# Patient Record
Sex: Female | Born: 1970 | Race: Black or African American | Hispanic: No | Marital: Single | Smoking: Former smoker
Health system: Southern US, Community
[De-identification: ages and names within clinical notes are randomized; demographics above are authoritative.]

## PROBLEM LIST (undated history)

## (undated) DIAGNOSIS — R079 Chest pain, unspecified: Secondary | ICD-10-CM

## (undated) DIAGNOSIS — M25569 Pain in unspecified knee: Secondary | ICD-10-CM

## (undated) DIAGNOSIS — K5900 Constipation, unspecified: Secondary | ICD-10-CM

## (undated) DIAGNOSIS — M549 Dorsalgia, unspecified: Secondary | ICD-10-CM

## (undated) DIAGNOSIS — M79606 Pain in leg, unspecified: Secondary | ICD-10-CM

## (undated) HISTORY — DX: Pain in unspecified knee: M25.569

## (undated) HISTORY — DX: Dorsalgia, unspecified: M54.9

## (undated) HISTORY — DX: Pain in leg, unspecified: M79.606

## (undated) HISTORY — DX: Chest pain, unspecified: R07.9

## (undated) HISTORY — DX: Constipation, unspecified: K59.00

## (undated) HISTORY — PX: TONSILLECTOMY: SUR1361

---

## 1998-01-03 ENCOUNTER — Emergency Department (HOSPITAL_COMMUNITY): Admission: EM | Admit: 1998-01-03 | Discharge: 1998-01-03 | Payer: Self-pay | Admitting: Emergency Medicine

## 1998-01-17 ENCOUNTER — Emergency Department (HOSPITAL_COMMUNITY): Admission: EM | Admit: 1998-01-17 | Discharge: 1998-01-17 | Payer: Self-pay | Admitting: Emergency Medicine

## 1998-08-26 ENCOUNTER — Ambulatory Visit (HOSPITAL_COMMUNITY): Admission: RE | Admit: 1998-08-26 | Discharge: 1998-08-26 | Payer: Self-pay | Admitting: Family Medicine

## 1998-08-26 ENCOUNTER — Encounter: Payer: Self-pay | Admitting: Family Medicine

## 2002-09-28 ENCOUNTER — Emergency Department (HOSPITAL_COMMUNITY): Admission: EM | Admit: 2002-09-28 | Discharge: 2002-09-28 | Payer: Self-pay | Admitting: Emergency Medicine

## 2003-01-04 ENCOUNTER — Emergency Department (HOSPITAL_COMMUNITY): Admission: EM | Admit: 2003-01-04 | Discharge: 2003-01-04 | Payer: Self-pay | Admitting: Emergency Medicine

## 2003-01-04 ENCOUNTER — Encounter: Payer: Self-pay | Admitting: Emergency Medicine

## 2003-07-19 ENCOUNTER — Inpatient Hospital Stay (HOSPITAL_COMMUNITY): Admission: AD | Admit: 2003-07-19 | Discharge: 2003-07-19 | Payer: Self-pay | Admitting: Obstetrics & Gynecology

## 2004-11-13 ENCOUNTER — Emergency Department (HOSPITAL_COMMUNITY): Admission: EM | Admit: 2004-11-13 | Discharge: 2004-11-13 | Payer: Self-pay | Admitting: Emergency Medicine

## 2006-01-25 ENCOUNTER — Emergency Department (HOSPITAL_COMMUNITY): Admission: EM | Admit: 2006-01-25 | Discharge: 2006-01-25 | Payer: Self-pay | Admitting: Emergency Medicine

## 2006-05-14 ENCOUNTER — Emergency Department (HOSPITAL_COMMUNITY): Admission: EM | Admit: 2006-05-14 | Discharge: 2006-05-14 | Payer: Self-pay | Admitting: Emergency Medicine

## 2006-10-15 ENCOUNTER — Emergency Department (HOSPITAL_COMMUNITY): Admission: EM | Admit: 2006-10-15 | Discharge: 2006-10-15 | Payer: Self-pay | Admitting: *Deleted

## 2009-11-19 ENCOUNTER — Emergency Department (HOSPITAL_COMMUNITY): Admission: EM | Admit: 2009-11-19 | Discharge: 2009-11-19 | Payer: Self-pay | Admitting: Family Medicine

## 2013-03-21 ENCOUNTER — Ambulatory Visit: Payer: Self-pay

## 2014-04-06 ENCOUNTER — Inpatient Hospital Stay (HOSPITAL_COMMUNITY): Payer: Medicaid Other

## 2014-04-06 ENCOUNTER — Encounter (HOSPITAL_COMMUNITY): Payer: Self-pay | Admitting: Emergency Medicine

## 2014-04-06 ENCOUNTER — Inpatient Hospital Stay (HOSPITAL_COMMUNITY)
Admission: AD | Admit: 2014-04-06 | Discharge: 2014-04-06 | Disposition: A | Discharge status: Home / Self Care | Payer: Self-pay | Source: Ambulatory Visit | Attending: Obstetrics and Gynecology | Admitting: Obstetrics and Gynecology

## 2014-04-06 ENCOUNTER — Encounter (HOSPITAL_COMMUNITY): Payer: Self-pay | Admitting: *Deleted

## 2014-04-06 ENCOUNTER — Emergency Department (INDEPENDENT_AMBULATORY_CARE_PROVIDER_SITE_OTHER)
Admission: EM | Admit: 2014-04-06 | Discharge: 2014-04-06 | Disposition: A | Discharge status: Home / Self Care | Payer: Self-pay | Source: Home / Self Care | Attending: Family Medicine | Admitting: Family Medicine

## 2014-04-06 DIAGNOSIS — N949 Unspecified condition associated with female genital organs and menstrual cycle: Secondary | ICD-10-CM

## 2014-04-06 DIAGNOSIS — N938 Other specified abnormal uterine and vaginal bleeding: Secondary | ICD-10-CM | POA: Insufficient documentation

## 2014-04-06 DIAGNOSIS — D259 Leiomyoma of uterus, unspecified: Secondary | ICD-10-CM | POA: Insufficient documentation

## 2014-04-06 DIAGNOSIS — N939 Abnormal uterine and vaginal bleeding, unspecified: Secondary | ICD-10-CM

## 2014-04-06 DIAGNOSIS — N83209 Unspecified ovarian cyst, unspecified side: Secondary | ICD-10-CM | POA: Insufficient documentation

## 2014-04-06 LAB — POCT URINALYSIS DIP (DEVICE)
Bilirubin Urine: NEGATIVE
GLUCOSE, UA: NEGATIVE mg/dL
KETONES UR: NEGATIVE mg/dL
NITRITE: NEGATIVE
PH: 5.5 (ref 5.0–8.0)
Protein, ur: NEGATIVE mg/dL
UROBILINOGEN UA: 0.2 mg/dL (ref 0.0–1.0)

## 2014-04-06 LAB — WET PREP, GENITAL
TRICH WET PREP: NONE SEEN
WBC, Wet Prep HPF POC: NONE SEEN
Yeast Wet Prep HPF POC: NONE SEEN

## 2014-04-06 LAB — POCT PREGNANCY, URINE: Preg Test, Ur: NEGATIVE

## 2014-04-06 NOTE — ED Provider Notes (Signed)
CSN: [id number]     Arrival date & time 04/06/14  0906 History   First MD Initiated Contact with Patient 04/06/14 0912     Chief Complaint  Patient presents with  . Vaginal Bleeding   (Consider location/radiation/quality/duration/timing/severity/associated sxs/prior Treatment) Patient is a 43 y.o. female presenting with vaginal bleeding. The history is provided by the patient.  Vaginal Bleeding Quality:  Dark red Severity:  Mild Onset quality:  Gradual Duration:  10 days Progression:  Unchanged Chronicity:  New Menstrual history:  Regular Number of tampons used:  2 Possible pregnancy: no   Context: spontaneously   Associated symptoms: abdominal pain   Associated symptoms: no fever and no vaginal discharge     History reviewed. No pertinent past medical history. History reviewed. No pertinent past surgical history. History reviewed. No pertinent family history. History  Substance Use Topics  . Smoking status: Current Some Day Smoker  . Smokeless tobacco: Not on file  . Alcohol Use: No   OB History   Grav Para Term Preterm Abortions TAB SAB Ect Mult Living                 Review of Systems  Constitutional: Negative.  Negative for fever.  Gastrointestinal: Positive for abdominal pain.  Genitourinary: Positive for vaginal bleeding, menstrual problem and pelvic pain. Negative for flank pain and vaginal discharge.    Allergies  Review of patient's allergies indicates no known allergies.  Home Medications   Prior to Admission medications   Not on File   BP 109/73  Pulse 60  Temp(Src) 98.5 F (36.9 C) (Oral)  Wt 151 lb (68.493 kg)  SpO2 100%  LMP 03/23/2014 Physical Exam  Nursing note and vitals reviewed. Constitutional: She is oriented to person, place, and time. She appears well-developed and well-nourished.  Abdominal: Soft. Bowel sounds are normal. She exhibits no distension and no mass. There is no tenderness. There is no rebound and no guarding.    Genitourinary: Uterus normal.    Uterus is not enlarged and not tender. Cervix exhibits no motion tenderness and no discharge. Right adnexum displays no mass, no tenderness and no fullness. Left adnexum displays no mass, no tenderness and no fullness. There is bleeding around the vagina. No tenderness around the vagina. No foreign body around the vagina. No vaginal discharge found.  Neurological: She is alert and oriented to person, place, and time.  Skin: Skin is warm and dry.    ED Course  Procedures (including critical care time) Labs Review Labs Reviewed  POCT URINALYSIS DIP (DEVICE) - Abnormal; Notable for the following:    Hgb urine dipstick TRACE (*)    Leukocytes, UA TRACE (*)    All other components within normal limits  POCT PREGNANCY, URINE    Imaging Review No results found.   MDM   1. DUB (dysfunctional uterine bleeding)    D[facility] with [facility], will see for further eval.    Billy Fischer, MD 04/06/14 [id number]

## 2014-04-06 NOTE — Discharge Instructions (Signed)
[facility].

## 2014-04-06 NOTE — [facility]U Provider Note (Signed)
Vanessa Carrillo is a 43 y.o. [phone] who presents to the [facility]U with irregular vaginal bleeding. She was evaluated in the Urgent Care earlier today and is her for ultrasound and further evaluation.  BP 117/68  Pulse 68  Temp(Src) 98.5 F (36.9 C)  Resp 18  Ht 5\' 2"  (1.575 m)  LMP 03/23/2014  Patient reports that she had a normal period about 2 weeks ago and then a week ago started back spotting. She has continued to have dark brown discharge. She has had a BTL and uses condoms for birth control. Current sex partner x 2 months. Hx of trichomonas and Chlamydia years ago. Last pap smear over 2 years ago in [facility] and was normal. She denies fever or chills. She had n/v x 2 yesterday but thought that was due to GERD because she had a lot of burping prior to the vomiting. She denies any other problems today.  Exam: abdomen soft, mildly tender LLQ, no guarding or rebound.  Pelvic: external genitalia without lesions, scant dark blood vaginal vault. No CMT, no adnexal tenderness, fullness right, uterus upper limits normal. Swabs obtained for GC, Chlamydia cultures and wet prep.    US Transvaginal Non-ob  04/06/2014   CLINICAL DATA:  Dysfunctional uterine bleeding and pelvic pain.  EXAM: TRANSABDOMINAL AND TRANSVAGINAL ULTRASOUND OF PELVIS  TECHNIQUE: Both transabdominal and transvaginal ultrasound examinations of the pelvis were performed. Transabdominal technique was performed for global imaging of the pelvis including uterus, ovaries, adnexal regions, and pelvic cul-de-sac. It was necessary to proceed with endovaginal exam following the transabdominal exam to visualize the ovaries and endometrium.  COMPARISON:  05/14/2006.  FINDINGS: Uterus  Measurements: 8.1 x 4.4 x 5.3 cm. Small fibroids are noted.  Endometrium  Thickness: 6.0 mm.  No focal abnormality visualized.  Right ovary  Measurements: 5.8 x 4.4 x 5.2 cm. Simple appearing 4.3 x 4.3 x 4.9 cm cyst. No worrisome sonographic features.  Left ovary   Measurements: 2.5 x 1.0 x 1.4 cm. Normal appearance/no adnexal mass.  Other findings  No free fluid.  IMPRESSION: 1. Two small uterine fibroids. 2. Normal endometrium. 3. Simple appearing 5 cm cyst associated with the right ovary. A followup pelvic ultrasound in 4-6 months is suggested. 4. Normal left ovary.   Electronically Signed   By: Kalman Jewels M.D.   On: 04/06/2014 11:29   US Pelvis Complete  04/06/2014   CLINICAL DATA:  Dysfunctional uterine bleeding and pelvic pain.  EXAM: TRANSABDOMINAL AND TRANSVAGINAL ULTRASOUND OF PELVIS  TECHNIQUE: Both transabdominal and transvaginal ultrasound examinations of the pelvis were performed. Transabdominal technique was performed for global imaging of the pelvis including uterus, ovaries, adnexal regions, and pelvic cul-de-sac. It was necessary to proceed with endovaginal exam following the transabdominal exam to visualize the ovaries and endometrium.  COMPARISON:  05/14/2006.  FINDINGS: Uterus  Measurements: 8.1 x 4.4 x 5.3 cm. Small fibroids are noted.  Endometrium  Thickness: 6.0 mm.  No focal abnormality visualized.  Right ovary  Measurements: 5.8 x 4.4 x 5.2 cm. Simple appearing 4.3 x 4.3 x 4.9 cm cyst. No worrisome sonographic features.  Left ovary  Measurements: 2.5 x 1.0 x 1.4 cm. Normal appearance/no adnexal mass.  Other findings  No free fluid.  IMPRESSION: 1. Two small uterine fibroids. 2. Normal endometrium. 3. Simple appearing 5 cm cyst associated with the right ovary. A followup pelvic ultrasound in 4-6 months is suggested. 4. Normal left ovary.   Electronically Signed   By: Veneta Penton.D.  On: 04/06/2014 11:29    Results for orders placed during the hospital encounter of 04/06/14 (from the past 24 hour(s))  WET PREP, GENITAL     Status: Abnormal   Collection Time    04/06/14 11:40 AM      Result Value Ref Range   Yeast Wet Prep HPF POC NONE SEEN  NONE SEEN   Trich, Wet Prep NONE SEEN  NONE SEEN   Clue Cells Wet Prep HPF POC FEW (*) NONE  SEEN   WBC, Wet Prep HPF POC NONE SEEN  NONE SEEN    A/P: 43 y.o. female with abnormal vaginal bleeding. Discussed with the patient clinical and lab findings and plan of care which includes follow up in the [facility] for Pap smear, mammogram and continued monitoring of right ovarian cyst and uterine fibroids. Patient agrees with plan and voices understanding. Will not start OC's or other medication at this time.

## 2014-04-06 NOTE — MAU Provider Note (Signed)

## 2014-04-06 NOTE — MAU Note (Signed)
PT presents from urgent care for follow up for vaginal bleeding.

## 2014-04-06 NOTE — ED Notes (Signed)
Pt  Reports    Vaginal  Bleeding    And      Low  abd  Pain  With        Symptoms  For  About  1  Week           Pt  Reports   Bleeding  Is  Only  Slight      Pt  Ambulated  With a  Slow  Steady  Gait

## 2014-04-06 NOTE — Discharge Instructions (Signed)
Call the [facility] to schedule a follow up for your Pap Smear, mammogram and further evaluation of your ovarian cyst and fibroids. Return here as needed for problems.

## 2014-04-08 LAB — GC/CHLAMYDIA PROBE AMP
CT PROBE, AMP APTIMA: NEGATIVE
GC Probe RNA: NEGATIVE

## 2014-04-10 ENCOUNTER — Telehealth: Payer: Self-pay | Admitting: *Deleted

## 2014-04-10 DIAGNOSIS — Z1231 Encounter for screening mammogram for malignant neoplasm of breast: Secondary | ICD-10-CM

## 2014-04-10 NOTE — Telephone Encounter (Addendum)
Vanessa Carrillo left a message she wants to get an appointment for a mammogram.  Per chart review has been seen by our doctors in [facility] and has an upcoming appt in September.  Scheduled mammogram for 04/18/14 at 10:45.

## 2014-04-10 NOTE — Telephone Encounter (Signed)
Vanessa Carrillo and gave her the appointment for her mammogram and reviewed her gyn appointment. She voiced understanding.

## 2014-04-18 ENCOUNTER — Ambulatory Visit (HOSPITAL_COMMUNITY): Payer: Medicaid Other

## 2014-06-05 ENCOUNTER — Encounter: Payer: Medicaid Other | Admitting: Obstetrics & Gynecology

## 2014-06-05 ENCOUNTER — Telehealth: Payer: Self-pay | Admitting: *Deleted

## 2014-06-05 NOTE — Telephone Encounter (Signed)
Vanessa Carrillo missed an appointment that was scheduled for  a pap smear as referred from [facility]. She states she thought it was tomorrow, she would like to reschedule it. I informed her I would have the registrars to reschedule and call her with a new appointment.

## 2014-06-17 ENCOUNTER — Encounter: Payer: Self-pay | Admitting: Obstetrics & Gynecology

## 2014-07-15 ENCOUNTER — Encounter (HOSPITAL_COMMUNITY): Payer: Self-pay | Admitting: *Deleted

## 2014-08-02 ENCOUNTER — Encounter: Payer: Medicaid Other | Admitting: Obstetrics & Gynecology

## 2014-08-02 ENCOUNTER — Telehealth: Payer: Self-pay | Admitting: *Deleted

## 2014-08-02 NOTE — Telephone Encounter (Signed)
Vanessa Carrillo missed an appointment today for pap smear.  Per chart she has missed previously as well. Called [name] and informed her she missed her scheduled appointment- she states she was not aware of appointment. Transferred her to registar to reschedule.

## 2014-10-18 ENCOUNTER — Encounter: Payer: Self-pay | Admitting: General Practice

## 2018-01-31 ENCOUNTER — Other Ambulatory Visit: Payer: Self-pay

## 2018-01-31 ENCOUNTER — Inpatient Hospital Stay (HOSPITAL_COMMUNITY)
Admission: AD | Admit: 2018-01-31 | Discharge: 2018-01-31 | Disposition: A | Discharge status: Home / Self Care | Payer: Self-pay | Source: Ambulatory Visit | Attending: Obstetrics and Gynecology | Admitting: Obstetrics and Gynecology

## 2018-01-31 ENCOUNTER — Encounter (HOSPITAL_COMMUNITY): Payer: Self-pay

## 2018-01-31 DIAGNOSIS — R1031 Right lower quadrant pain: Secondary | ICD-10-CM | POA: Insufficient documentation

## 2018-01-31 LAB — URINALYSIS, ROUTINE W REFLEX MICROSCOPIC
Bilirubin Urine: NEGATIVE
Glucose, UA: NEGATIVE mg/dL
Ketones, ur: 5 mg/dL — AB
NITRITE: NEGATIVE
Protein, ur: NEGATIVE mg/dL
Specific Gravity, Urine: 1.024 (ref 1.005–1.030)
pH: 5 (ref 5.0–8.0)

## 2018-01-31 LAB — CBC
HCT: 42.3 % (ref 36.0–46.0)
HEMOGLOBIN: 14.1 g/dL (ref 12.0–15.0)
MCH: 31.4 pg (ref 26.0–34.0)
MCHC: 33.3 g/dL (ref 30.0–36.0)
MCV: 94.2 fL (ref 78.0–100.0)
Platelets: 176 10*3/uL (ref 150–400)
RBC: 4.49 MIL/uL (ref 3.87–5.11)
RDW: 13.3 % (ref 11.5–15.5)
WBC: 9.1 10*3/uL (ref 4.0–10.5)

## 2018-01-31 LAB — POCT PREGNANCY, URINE: PREG TEST UR: NEGATIVE

## 2018-01-31 MED ORDER — KETOROLAC TROMETHAMINE 30 MG/ML IJ SOLN
30.0000 mg | Freq: Once | INTRAMUSCULAR | Status: AC
  Administered 2018-01-31: 30 mg via INTRAMUSCULAR
  Filled 2018-01-31: qty 1

## 2018-01-31 NOTE — MAU Provider Note (Signed)
Patient Vanessa Carrillo is a 47 y.o. [phone] Non-pregnant female here with complaints of side pain. Today she had nausea, vomiting, felt tingly and felt like she was having hot flashes.    History    CSN: [id number]  Arrival date and time: 01/31/18 1621   First Provider Initiated Contact with Patient 01/31/18 1755      Chief Complaint  Patient presents with  . Abdominal Pain   Abdominal Pain  This is a new problem. The current episode started today. The onset quality is sudden. The problem occurs constantly. The problem has been gradually improving. The pain is at a severity of 6/10. Associated symptoms include nausea and vomiting. Pertinent negatives include no diarrhea.    She also felt lightheaded when she tried to have a BM. She last had a bowel movement last night. She denies constipation; states that she has regular BM.    Patient went to sleep after work and woke up at 4:30. She ate a sandwich and chips, and then she started gagging. She states that she had finished eating about 10-15 minutes prior to the gagging episode. She threw up at 5 pm. She still feels a little itchy in her throat right now. She does not have to throw up right now.   OB History    Gravida  4   Para  2   Term  2   Preterm      AB  2   Living  2     SAB      TAB  2   Ectopic      Multiple      Live Births              History reviewed. No pertinent past medical history.  Past Surgical History:  Procedure Laterality Date  . CESAREAN SECTION    . TONSILLECTOMY      Family History  Problem Relation Age of Onset  . Diabetes Mother   . Diabetes Father   . Diabetes Maternal Grandmother     Social History   Tobacco Use  . Smoking status: Never Smoker  . Smokeless tobacco: Never Used  Substance Use Topics  . Alcohol use: No  . Drug use: Yes    Frequency: 2.0 times per week    Types: Marijuana    Allergies: No Known Allergies  No medications prior to admission.     Review of Systems  HENT: Negative.   Respiratory: Negative.   Gastrointestinal: Positive for abdominal pain, nausea and vomiting. Negative for diarrhea.  Genitourinary: Negative.  Negative for vaginal bleeding and vaginal discharge.  Neurological: Negative.   Psychiatric/Behavioral: Negative.    Physical Exam   Blood pressure 97/64, pulse 75, temperature 98.8 F (37.1 C), temperature source Oral, resp. rate 18, weight 177 lb 4 oz (80.4 kg), last menstrual period 01/04/2018, SpO2 100 %.  Physical Exam  Constitutional: She is oriented to person, place, and time. She appears well-developed.  HENT:  Head: Normocephalic.  Neck: Normal range of motion.  Respiratory: Effort normal.  Genitourinary:  Genitourinary Comments: Normal external female genitalia; suprapubic and adnexa are soft with no masses or tenderness.   Musculoskeletal: Normal range of motion.  Neurological: She is alert and oriented to person, place, and time.  Skin: Skin is warm and dry.  Psychiatric: She has a normal mood and affect.    MAU Course  Procedures  MDM -CBC normal -UA normal -No fever, VSS -30 mg of toradol IM and  pain now a 3/10; patient desirous of discharge.   Given patient's lack of rebound tenderness, guarding, elevated WBC or fever, CT scan or pelvic US not indicated today as low index of suspicion for appendicitis or torsion.   Assessment and Plan   1. Right lower quadrant abdominal pain    2. Discussed with patient that, given her vital signs, physical exam and lab results, my suspicion that her condition is urgent is low.   3. Reviewed with patient that lower abdominal pain can be very non-specific, it may be related to her menstrual cycle, especially since she is due to have her period in another 10 days or so.   4. Instructed patient to keep a diary of this pain and see if it returns at other points in her cycle.   5. Reviewed with patient that she should visit [facility] or [facility]  if she feels like her condition is worsening or changing. Patient verbalized understanding.  [name] 01/31/2018, 5:55 PM

## 2018-01-31 NOTE — MAU Note (Signed)
Was asleep, next thing she knew she woke up and was gagging, then she got hot,and sweaty, sudden on set of pain in her RLQ.  Feels like something is caught in her throat, tingly feeling.

## 2018-01-31 NOTE — Discharge Instructions (Signed)
-  keep track of whether or not this pain goes away or comes on at different points during your cycle -take ibuprofen for pain (up to 800 mg every 8 hours) -Return to [facility] if pain persists or you have increased nausea/vomiting or fever.

## 2019-01-04 ENCOUNTER — Other Ambulatory Visit: Payer: Self-pay

## 2019-01-04 ENCOUNTER — Encounter (HOSPITAL_COMMUNITY): Payer: Self-pay | Admitting: *Deleted

## 2019-01-04 ENCOUNTER — Emergency Department (HOSPITAL_COMMUNITY)
Admission: EM | Admit: 2019-01-04 | Discharge: 2019-01-04 | Disposition: A | Discharge status: Home / Self Care | Payer: Self-pay | Attending: Emergency Medicine | Admitting: Emergency Medicine

## 2019-01-04 DIAGNOSIS — M546 Pain in thoracic spine: Secondary | ICD-10-CM | POA: Insufficient documentation

## 2019-01-04 DIAGNOSIS — M545 Low back pain: Secondary | ICD-10-CM | POA: Insufficient documentation

## 2019-01-04 DIAGNOSIS — M7918 Myalgia, other site: Secondary | ICD-10-CM

## 2019-01-04 DIAGNOSIS — F1290 Cannabis use, unspecified, uncomplicated: Secondary | ICD-10-CM | POA: Insufficient documentation

## 2019-01-04 NOTE — Discharge Instructions (Addendum)
Please read attached information. If you experience any new or worsening signs or symptoms please return to the emergency room for evaluation. Please follow-up with your primary care provider or specialist as discussed.  °
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
## 2019-06-09 ENCOUNTER — Emergency Department (HOSPITAL_COMMUNITY)
Admission: EM | Admit: 2019-06-09 | Discharge: 2019-06-09 | Disposition: A | Discharge status: Home / Self Care | Payer: No Typology Code available for payment source | Attending: Emergency Medicine | Admitting: Emergency Medicine

## 2019-06-09 ENCOUNTER — Emergency Department (HOSPITAL_COMMUNITY): Payer: No Typology Code available for payment source

## 2019-06-09 ENCOUNTER — Encounter (HOSPITAL_COMMUNITY): Payer: Self-pay

## 2019-06-09 ENCOUNTER — Other Ambulatory Visit: Payer: Self-pay

## 2019-06-09 DIAGNOSIS — Y999 Unspecified external cause status: Secondary | ICD-10-CM | POA: Diagnosis not present

## 2019-06-09 DIAGNOSIS — R0789 Other chest pain: Secondary | ICD-10-CM | POA: Diagnosis not present

## 2019-06-09 DIAGNOSIS — S161XXA Strain of muscle, fascia and tendon at neck level, initial encounter: Secondary | ICD-10-CM | POA: Insufficient documentation

## 2019-06-09 DIAGNOSIS — S63502A Unspecified sprain of left wrist, initial encounter: Secondary | ICD-10-CM | POA: Diagnosis not present

## 2019-06-09 DIAGNOSIS — Y929 Unspecified place or not applicable: Secondary | ICD-10-CM | POA: Diagnosis not present

## 2019-06-09 DIAGNOSIS — M549 Dorsalgia, unspecified: Secondary | ICD-10-CM | POA: Insufficient documentation

## 2019-06-09 DIAGNOSIS — F1290 Cannabis use, unspecified, uncomplicated: Secondary | ICD-10-CM | POA: Diagnosis not present

## 2019-06-09 DIAGNOSIS — Y939 Activity, unspecified: Secondary | ICD-10-CM | POA: Insufficient documentation

## 2019-06-09 DIAGNOSIS — T07XXXA Unspecified multiple injuries, initial encounter: Secondary | ICD-10-CM | POA: Diagnosis present

## 2019-06-09 MED ORDER — METHOCARBAMOL 500 MG PO TABS: 500.0000 mg | ORAL_TABLET | Freq: Two times a day (BID) | ORAL | 0 refills | Status: DC

## 2019-06-09 MED ORDER — HYDROCODONE-ACETAMINOPHEN 5-325 MG PO TABS
1.0000 | ORAL_TABLET | Freq: Once | ORAL | Status: AC
  Administered 2019-06-09: 10:00:00 1 via ORAL
  Filled 2019-06-09: qty 1
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
## 2019-06-09 NOTE — ED Notes (Signed)
Patient transported to X-ray 
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
## 2019-06-09 NOTE — ED Notes (Signed)
ED Provider at bedside. 

## 2019-07-05 ENCOUNTER — Other Ambulatory Visit: Payer: Self-pay

## 2019-07-05 DIAGNOSIS — Z20822 Contact with and (suspected) exposure to covid-19: Secondary | ICD-10-CM

## 2019-07-07 LAB — NOVEL CORONAVIRUS, NAA: SARS-CoV-2, NAA: NOT DETECTED

## 2021-01-19 ENCOUNTER — Encounter (HOSPITAL_BASED_OUTPATIENT_CLINIC_OR_DEPARTMENT_OTHER): Payer: Self-pay | Admitting: Emergency Medicine

## 2021-01-19 ENCOUNTER — Other Ambulatory Visit: Payer: Self-pay

## 2021-01-19 ENCOUNTER — Emergency Department (HOSPITAL_BASED_OUTPATIENT_CLINIC_OR_DEPARTMENT_OTHER)
Admission: EM | Admit: 2021-01-19 | Discharge: 2021-01-19 | Disposition: A | Discharge status: Home / Self Care | Payer: Medicaid Other | Attending: Emergency Medicine | Admitting: Emergency Medicine

## 2021-01-19 ENCOUNTER — Emergency Department (HOSPITAL_BASED_OUTPATIENT_CLINIC_OR_DEPARTMENT_OTHER): Payer: Medicaid Other

## 2021-01-19 ENCOUNTER — Emergency Department (HOSPITAL_BASED_OUTPATIENT_CLINIC_OR_DEPARTMENT_OTHER): Payer: Medicaid Other | Admitting: Radiology

## 2021-01-19 DIAGNOSIS — M79604 Pain in right leg: Secondary | ICD-10-CM

## 2021-01-19 DIAGNOSIS — M7121 Synovial cyst of popliteal space [Baker], right knee: Secondary | ICD-10-CM | POA: Insufficient documentation

## 2021-01-19 DIAGNOSIS — M25561 Pain in right knee: Secondary | ICD-10-CM

## 2021-01-19 MED ORDER — OXYCODONE-ACETAMINOPHEN 5-325 MG PO TABS: 1.0000 | ORAL_TABLET | ORAL | 0 refills | Status: DC | PRN

## 2021-01-19 MED ORDER — OXYCODONE-ACETAMINOPHEN 5-325 MG PO TABS
1.0000 | ORAL_TABLET | Freq: Once | ORAL | Status: AC
  Administered 2021-01-19: 1 via ORAL
  Filled 2021-01-19: qty 1

## 2021-01-19 NOTE — ED Notes (Signed)
Patient verbalizes understanding of discharge instructions. Opportunity for questioning and answers were provided. Armband removed by staff, pt discharged from ED.  

## 2021-01-19 NOTE — ED Provider Notes (Signed)
[facility] Provider Note   CSN: [id number] Arrival date & time: 01/19/21  [id number]     History Chief Complaint  Patient presents with  . Leg Pain    Vanessa Carrillo is a 50 y.o. female.  The history is provided by the patient and medical records. No language interpreter was used.  Leg Pain Location:  Knee Time since incident:  5 days Injury: no   Knee location:  R knee Pain details:    Quality:  Aching   Radiates to:  Does not radiate   Severity:  Severe   Onset quality:  Gradual   Timing:  Constant   Progression:  Unchanged Chronicity:  New Dislocation: no   Tetanus status:  Unknown Prior injury to area:  No Relieved by:  Nothing Worsened by:  Bearing weight Ineffective treatments:  None tried Associated symptoms: no back pain, no fatigue, no fever, no muscle weakness, no neck pain, no numbness and no tingling        History reviewed. No pertinent past medical history.  There are no problems to display for this patient.   Past Surgical History:  Procedure Laterality Date  . CESAREAN SECTION    . TONSILLECTOMY       OB History    Gravida  4   Para  2   Term  2   Preterm      AB  2   Living  2     SAB      IAB  2   Ectopic      Multiple      Live Births              Family History  Problem Relation Age of Onset  . Diabetes Mother   . Diabetes Father   . Diabetes Maternal Grandmother     Social History   Tobacco Use  . Smoking status: Never Smoker  . Smokeless tobacco: Never Used  Substance Use Topics  . Alcohol use: No  . Drug use: Yes    Frequency: 2.0 times per week    Types: Marijuana    Home Medications Prior to Admission medications   Medication Sig Start Date End Date Taking? Authorizing Provider  methocarbamol (ROBAXIN) 500 MG tablet Take 1 tablet (500 mg total) by mouth 2 (two) times daily. 06/09/19   Volanda Napoleon, PA-C    Allergies    Patient has no known  allergies.  Review of Systems   Review of Systems  Constitutional: Negative for chills, fatigue and fever.  HENT: Negative for congestion.   Respiratory: Negative for cough, chest tightness, shortness of breath and wheezing.   Cardiovascular: Negative for chest pain.  Gastrointestinal: Negative for abdominal pain, constipation, diarrhea and nausea.  Genitourinary: Negative for flank pain.  Musculoskeletal: Negative for back pain, neck pain and neck stiffness.  Skin: Negative for rash and wound.  Neurological: Negative for headaches.  Psychiatric/Behavioral: Negative for agitation and confusion.  All other systems reviewed and are negative.   Physical Exam Updated Vital Signs BP 135/87 (BP Location: Right Arm)   Pulse 67   Temp 98.5 F (36.9 C) (Oral)   Resp 17   Ht 5\' 2"  (1.575 m)   Wt 74.8 kg   LMP 01/04/2021   SpO2 99%   BMI 30.18 kg/m   Physical Exam Vitals and nursing note reviewed.  Constitutional:      General: She is not in acute distress.    Appearance: She  is well-developed. She is not ill-appearing, toxic-appearing or diaphoretic.  HENT:     Head: Normocephalic and atraumatic.     Mouth/Throat:     Mouth: Mucous membranes are moist.  Eyes:     Conjunctiva/sclera: Conjunctivae normal.     Pupils: Pupils are equal, round, and reactive to light.  Cardiovascular:     Rate and Rhythm: Normal rate and regular rhythm.     Heart sounds: No murmur heard.   Pulmonary:     Effort: Pulmonary effort is normal. No respiratory distress.     Breath sounds: Normal breath sounds.  Abdominal:     Palpations: Abdomen is soft.     Tenderness: There is no abdominal tenderness. There is no right CVA tenderness, left CVA tenderness, guarding or rebound.  Musculoskeletal:        General: Tenderness present.     Cervical back: Neck supple.     Right knee: No swelling, deformity, erythema or crepitus. Normal range of motion. Tenderness present.     Right lower leg: No edema.      Left lower leg: No edema.       Legs:     Comments: Normal, sensation, strength, pulses distally.  Tenderness in the knee and behind the knee just going up the leg.  No redness or warmth.   Skin:    General: Skin is warm and dry.     Capillary Refill: Capillary refill takes less than 2 seconds.     Findings: No erythema.  Neurological:     General: No focal deficit present.     Mental Status: She is alert.  Psychiatric:        Mood and Affect: Mood normal.     ED Results / Procedures / Treatments   Labs (all labs ordered are listed, but only abnormal results are displayed) Labs Reviewed - No data to display  EKG None  Radiology US Venous Img Lower Unilateral Right  Result Date: 01/19/2021 CLINICAL DATA:  Right lower extremity pain after moving furniture 1 week ago. Evaluate for DVT. EXAM: RIGHT LOWER EXTREMITY VENOUS DOPPLER ULTRASOUND TECHNIQUE: Gray-scale sonography with graded compression, as well as color Doppler and duplex ultrasound were performed to evaluate the lower extremity deep venous systems from the level of the common femoral vein and including the common femoral, femoral, profunda femoral, popliteal and calf veins including the posterior tibial, peroneal and gastrocnemius veins when visible. The superficial great saphenous vein was also interrogated. Spectral Doppler was utilized to evaluate flow at rest and with distal augmentation maneuvers in the common femoral, femoral and popliteal veins. COMPARISON:  None. FINDINGS: Contralateral Common Femoral Vein: Respiratory phasicity is normal and symmetric with the symptomatic side. No evidence of thrombus. Normal compressibility. Common Femoral Vein: No evidence of thrombus. Normal compressibility, respiratory phasicity and response to augmentation. Saphenofemoral Junction: No evidence of thrombus. Normal compressibility and flow on color Doppler imaging. Profunda Femoral Vein: No evidence of thrombus. Normal  compressibility and flow on color Doppler imaging. Femoral Vein: No evidence of thrombus. Normal compressibility, respiratory phasicity and response to augmentation. Popliteal Vein: No evidence of thrombus. Normal compressibility, respiratory phasicity and response to augmentation. Calf Veins: No evidence of thrombus. Normal compressibility and flow on color Doppler imaging. Superficial Great Saphenous Vein: No evidence of thrombus. Normal compressibility. Venous Reflux:  None. Other Findings: Note is made of an approximately 5.1 x 5.1 x 3.0 cm fluid collection within the right popliteal fossa compatible with a Baker's cyst. IMPRESSION: 1. No  evidence of DVT within the right lower extremity. 2. Incidental note made of an approximately 5.1 cm right-sided Baker's cyst. Electronically Signed   By: Sandi Mariscal M.D.   On: 01/19/2021 11:37   DG Knee Complete 4 Views Right  Result Date: 01/19/2021 CLINICAL DATA:  Right knee and leg pain, medial and posterior knee pain EXAM: RIGHT KNEE - COMPLETE 4+ VIEW COMPARISON:  None. FINDINGS: No evidence of fracture, dislocation, or joint effusion. Mild lateral compartment osteophytosis without other evidence of osteophytosis and preserved joint spaces throughout. Soft tissues are unremarkable. IMPRESSION: 1.  No fracture or dislocation of the right knee. 2. Mild lateral compartment osteophytosis without other evidence of osteophytosis and preserved joint spaces throughout. 3.  No joint effusion. Electronically Signed   By: Eddie Candle M.D.   On: 01/19/2021 11:51    Procedures Procedures   Medications Ordered in ED Medications  oxyCODONE-acetaminophen (PERCOCET/ROXICET) 5-325 MG per tablet 1 tablet (1 tablet Oral Given 01/19/21 1220)    ED Course  I have reviewed the triage vital signs and the nursing notes.  Pertinent labs & imaging results that were available during my care of the patient were reviewed by me and considered in my medical decision making (see chart  for details).    MDM Rules/Calculators/A&P                          DEBBIE YEARICK is a 50 y.o. female with no significant past medical history who presents with several days of right knee and leg pain.  She reports that she is very concerned about blood clot as a close member had 1 and died.  She reports that she was moving around furniture in her room over the last week and was using her to push furniture around.  She denies an acute trauma event but reports since then it has been hurting.  She describes a 7 out of 10 at baseline and up to a 10 out of 10 in severity at times.  She denies numbness or weakness.  Denies any hip pain or other symptoms.  She denies a history of DVT or PE.  Denies any history of knee surgery or injuries.  On exam, patient does have tenderness in the bones of the right knee but does have normal range of motion with passive manipulation.  She has tenderness in the posterior of the knee and the distal posterior upper leg.  Normal sensation and strength and pulses distally.  No rashes seen.  It is not warm or significantly swollen on my exam.  No hip tenderness.  Lungs clear and chest nontender.  Exam otherwise unremarkable.  Clinically I suspect soft tissue pain and injury with overuse from all of the furniture manipulation last week.  Given her family history and concern, we will get a DVT ultrasound to rule out DVT in the posterior knee and leg.  We will also get x-ray to look for bony injury.  If work-up is reassuring, anticipate knee immobilization and follow-up with orthopedics for further likely soft tissue or ligamentous injury.  1:02 PM DVT ultrasound was negative for DVT but did show a Baker's cyst.  This likely explains the knot she was feeling.  X-ray shows no fracture dislocation but shows some arthritis.  We will place the patient knee immobilizer and give crutches.  She will be given her pain medicine as I still cannot rule out a ligamentous type injury.   Patient will  follow up with orthopedics and treat the Baker's cyst symptomatically.  She had no other questions or concerns and was discharged in good condition.     Final Clinical Impression(s) / ED Diagnoses Final diagnoses:  Baker cyst, right  Right leg pain  Acute pain of right knee    Rx / DC Orders ED Discharge Orders         Ordered    oxyCODONE-acetaminophen (PERCOCET/ROXICET) 5-325 MG tablet  Every 4 hours PRN        01/19/21 1303          Clinical Impression: 1. Baker cyst, right   2. Right leg pain   3. Acute pain of right knee     Disposition: Discharge  Condition: Good  I have discussed the results, Dx and Tx plan with the pt(& family if present). He/she/they expressed understanding and agree(s) with the plan. Discharge instructions discussed at great length. Strict return precautions discussed and pt &/or family have verbalized understanding of the instructions. No further questions at time of discharge.    New Prescriptions   OXYCODONE-ACETAMINOPHEN (PERCOCET/ROXICET) 5-325 MG TABLET    Take 1 tablet by mouth every 4 (four) hours as needed for severe pain.    Follow Up: [facility], [address] [place] [address] [address] [phone]   with ortho if symptoms persist     Raheim Beutler, Gwenyth Allegra, MD 01/19/21 1306

## 2021-01-19 NOTE — Discharge Instructions (Signed)
Your ultrasound today showed no evidence of blood clot but did show the Baker's cyst we discussed.  As I cannot rule out a ligamentous or soft tissue injury, we will give you the knee immobilizer and crutches and pain medicine.  Please follow-up with orthopedics if your symptoms persist.  Please use rest, elevation, ice, and compression help with your symptoms as well.  If any symptoms change or worsen, please return to the nearest emergency department.

## 2021-01-19 NOTE — ED Triage Notes (Signed)
Pt states that she has a a knot behind her right knee for the past 5 days. Pt stated that she was moving furniture around about the same times symptoms first appeared.

## 2021-01-19 NOTE — ED Notes (Signed)
Patient transported to Ultrasound 

## 2021-04-08 ENCOUNTER — Ambulatory Visit: Payer: No Typology Code available for payment source | Attending: Critical Care Medicine

## 2021-04-08 DIAGNOSIS — Z20822 Contact with and (suspected) exposure to covid-19: Secondary | ICD-10-CM

## 2021-04-09 LAB — NOVEL CORONAVIRUS, NAA: SARS-CoV-2, NAA: NOT DETECTED

## 2021-04-09 LAB — SARS-COV-2, NAA 2 DAY TAT

## 2022-04-21 ENCOUNTER — Ambulatory Visit: Payer: Commercial Managed Care - HMO | Admitting: Family Medicine

## 2022-04-21 VITALS — BP 128/89 | HR 65 | Temp 97.9°F | Ht 62.0 in | Wt 192.0 lb

## 2022-04-21 DIAGNOSIS — Z129 Encounter for screening for malignant neoplasm, site unspecified: Secondary | ICD-10-CM

## 2022-04-21 DIAGNOSIS — R5383 Other fatigue: Secondary | ICD-10-CM

## 2022-04-21 DIAGNOSIS — B379 Candidiasis, unspecified: Secondary | ICD-10-CM

## 2022-04-21 DIAGNOSIS — L309 Dermatitis, unspecified: Secondary | ICD-10-CM

## 2022-04-21 LAB — POCT URINALYSIS DIPSTICK
Bilirubin, UA: NEGATIVE
Blood, UA: NEGATIVE
Glucose, UA: NEGATIVE
Ketones, UA: NEGATIVE
Leukocytes, UA: NEGATIVE
Protein, UA: NEGATIVE
Spec Grav, UA: 1.025
Urobilinogen, UA: 0.2 U/dL
pH, UA: 5

## 2022-04-21 MED ORDER — LORATADINE 10 MG PO TABS: 10.0000 mg | ORAL_TABLET | Freq: Every day | ORAL | 11 refills | Status: DC

## 2022-04-21 MED ORDER — HYDROCORTISONE 1 % EX OINT: 1.0000 | TOPICAL_OINTMENT | Freq: Two times a day (BID) | CUTANEOUS | 0 refills | Status: DC

## 2022-04-21 NOTE — Progress Notes (Unsigned)
   New Patient Office Visit  Subjective    Patient ID: Vanessa Carrillo, female    DOB: 09/13/1971  Age: 51 y.o. MRN: [id number]  CC:  Chief Complaint  Patient presents with   Establish Care    HPI [name] presents to establish care ***  Outpatient Encounter Medications as of 04/21/2022  Medication Sig   methocarbamol (ROBAXIN) 500 MG tablet Take 1 tablet (500 mg total) by mouth 2 (two) times daily. (Patient not taking: Reported on 04/21/2022)   oxyCODONE-acetaminophen (PERCOCET/ROXICET) 5-325 MG tablet Take 1 tablet by mouth every 4 (four) hours as needed for severe pain. (Patient not taking: Reported on 04/21/2022)   No facility-administered encounter medications on file as of 04/21/2022.    No past medical history on file.  Past Surgical History:  Procedure Laterality Date   CESAREAN SECTION     TONSILLECTOMY      Family History  Problem Relation Age of Onset   Diabetes Mother    Diabetes Father    Diabetes Maternal Grandmother     Social History   Socioeconomic History   Marital status: Single    Spouse name: Not on file   Number of children: Not on file   Years of education: Not on file   Highest education level: Not on file  Occupational History   Not on file  Tobacco Use   Smoking status: Never   Smokeless tobacco: Never  Substance and Sexual Activity   Alcohol use: No   Drug use: Yes    Frequency: 2.0 times per week    Types: Marijuana   Sexual activity: Yes    Birth control/protection: Surgical  Other Topics Concern   Not on file  Social History Narrative   Not on file   Social Determinants of Health   Financial Resource Strain: Not on file  Food Insecurity: Not on file  Transportation Needs: Not on file  Physical Activity: Not on file  Stress: Not on file  Social Connections: Not on file  Intimate Partner Violence: Not on file    ROS      Objective    BP 128/89   Pulse 65   Temp 97.9 F (36.6 C)   Ht '5\' 2"'$  (1.575 m)   Wt  192 lb (87.1 kg)   SpO2 96%   BMI 35.12 kg/m   Physical Exam  {Labs (Optional):23779}    EKG - nsr, nl axis,  no stemi, nl ekg, rr 986, pr 144  Assessment & Plan:   Problem List Items Addressed This Visit   None Visit Diagnoses     Candida infection    -  Primary   Relevant Orders   CBC with Differential/Platelet   Comprehensive metabolic panel   Eczema, unspecified type       Cancer screening       Relevant Orders   MM Digital Screening   Cologuard   Morbid obesity ([place])       Relevant Orders   Hgb A1c w/o eAG   Lipid Panel w/o Chol/HDL Ratio   TSH + free T4   Other fatigue       Relevant Orders   POCT urinalysis dipstick   EKG 12-Lead       No follow-ups on file.   Elwin Mocha, MD

## 2022-04-21 NOTE — Progress Notes (Unsigned)
U/A collected. TM

## 2022-04-22 LAB — CBC WITH DIFFERENTIAL/PLATELET
Basophils Absolute: 0 10*3/uL (ref 0.0–0.2)
Basos: 0 %
EOS (ABSOLUTE): 0.1 10*3/uL (ref 0.0–0.4)
Eos: 1 %
Hematocrit: 44.3 % (ref 34.0–46.6)
Hemoglobin: 15 g/dL (ref 11.1–15.9)
Immature Grans (Abs): 0 10*3/uL (ref 0.0–0.1)
Immature Granulocytes: 0 %
Lymphocytes Absolute: 2.7 10*3/uL (ref 0.7–3.1)
Lymphs: 47 %
MCH: 32 pg (ref 26.6–33.0)
MCHC: 33.9 g/dL (ref 31.5–35.7)
MCV: 95 fL (ref 79–97)
Monocytes Absolute: 0.4 10*3/uL (ref 0.1–0.9)
Monocytes: 8 %
Neutrophils Absolute: 2.5 10*3/uL (ref 1.4–7.0)
Neutrophils: 44 %
Platelets: 182 10*3/uL (ref 150–450)
RBC: 4.69 x10E6/uL (ref 3.77–5.28)
RDW: 12.9 % (ref 11.7–15.4)
WBC: 5.8 10*3/uL (ref 3.4–10.8)

## 2022-04-22 LAB — COMPREHENSIVE METABOLIC PANEL
ALT: 9 IU/L (ref 0–32)
AST: 13 IU/L (ref 0–40)
Albumin/Globulin Ratio: 1.5 (ref 1.2–2.2)
Albumin: 4.1 g/dL (ref 3.8–4.9)
Alkaline Phosphatase: 100 IU/L (ref 44–121)
BUN/Creatinine Ratio: 16 (ref 9–23)
BUN: 11 mg/dL (ref 6–24)
Bilirubin Total: <0.2 mg/dL (ref 0.0–1.2)
CO2: 22 mmol/L (ref 20–29)
Calcium: 9 mg/dL (ref 8.7–10.2)
Chloride: 106 mmol/L (ref 96–106)
Creatinine, Ser: 0.67 mg/dL (ref 0.57–1.00)
Globulin, Total: 2.8 g/dL (ref 1.5–4.5)
Glucose: 99 mg/dL (ref 70–99)
Potassium: 4.1 mmol/L (ref 3.5–5.2)
Sodium: 142 mmol/L (ref 134–144)
Total Protein: 6.9 g/dL (ref 6.0–8.5)
eGFR: 106 mL/min/{1.73_m2} (ref >59)

## 2022-04-22 LAB — LIPID PANEL W/O CHOL/HDL RATIO
Cholesterol, Total: 193 mg/dL (ref 100–199)
HDL: 68 mg/dL (ref >39)
LDL Chol Calc (NIH): 109 mg/dL — ABNORMAL HIGH (ref 0–99)
Triglycerides: 91 mg/dL (ref 0–149)
VLDL Cholesterol Cal: 16 mg/dL (ref 5–40)

## 2022-04-22 LAB — TSH+FREE T4
Free T4: 0.91 ng/dL (ref 0.82–1.77)
TSH: 1.47 u[IU]/mL (ref 0.450–4.500)

## 2022-04-22 LAB — HGB A1C W/O EAG: Hgb A1c MFr Bld: 5.6 % (ref 4.8–5.6)

## 2022-04-30 ENCOUNTER — Ambulatory Visit: Payer: Commercial Managed Care - HMO | Admitting: Nurse Practitioner

## 2022-05-15 ENCOUNTER — Other Ambulatory Visit: Payer: Self-pay

## 2022-05-15 ENCOUNTER — Encounter (HOSPITAL_BASED_OUTPATIENT_CLINIC_OR_DEPARTMENT_OTHER): Payer: Self-pay | Admitting: Emergency Medicine

## 2022-05-15 ENCOUNTER — Emergency Department (HOSPITAL_BASED_OUTPATIENT_CLINIC_OR_DEPARTMENT_OTHER)
Admission: EM | Admit: 2022-05-15 | Discharge: 2022-05-15 | Disposition: A | Discharge status: Home / Self Care | Payer: Commercial Managed Care - HMO | Attending: Emergency Medicine | Admitting: Emergency Medicine

## 2022-05-15 ENCOUNTER — Emergency Department (HOSPITAL_BASED_OUTPATIENT_CLINIC_OR_DEPARTMENT_OTHER): Payer: Commercial Managed Care - HMO

## 2022-05-15 DIAGNOSIS — I10 Essential (primary) hypertension: Secondary | ICD-10-CM | POA: Diagnosis not present

## 2022-05-15 DIAGNOSIS — R0789 Other chest pain: Secondary | ICD-10-CM | POA: Diagnosis not present

## 2022-05-15 DIAGNOSIS — R079 Chest pain, unspecified: Secondary | ICD-10-CM | POA: Diagnosis present

## 2022-05-15 DIAGNOSIS — E119 Type 2 diabetes mellitus without complications: Secondary | ICD-10-CM | POA: Insufficient documentation

## 2022-05-15 LAB — BASIC METABOLIC PANEL
Anion gap: 6 (ref 5–15)
BUN: 18 mg/dL (ref 6–20)
CO2: 23 mmol/L (ref 22–32)
Calcium: 8.6 mg/dL — ABNORMAL LOW (ref 8.9–10.3)
Chloride: 109 mmol/L (ref 98–111)
Creatinine, Ser: 0.91 mg/dL (ref 0.44–1.00)
GFR, Estimated: >60 mL/min (ref >60)
Glucose, Bld: 115 mg/dL — ABNORMAL HIGH (ref 70–99)
Potassium: 3.4 mmol/L — ABNORMAL LOW (ref 3.5–5.1)
Sodium: 138 mmol/L (ref 135–145)

## 2022-05-15 LAB — CBC
HCT: 40.9 % (ref 36.0–46.0)
Hemoglobin: 13.6 g/dL (ref 12.0–15.0)
MCH: 31.6 pg (ref 26.0–34.0)
MCHC: 33.3 g/dL (ref 30.0–36.0)
MCV: 94.9 fL (ref 80.0–100.0)
Platelets: 163 10*3/uL (ref 150–400)
RBC: 4.31 MIL/uL (ref 3.87–5.11)
RDW: 13.5 % (ref 11.5–15.5)
WBC: 6.9 10*3/uL (ref 4.0–10.5)
nRBC: 0 % (ref 0.0–0.2)

## 2022-05-15 LAB — TROPONIN I (HIGH SENSITIVITY): Troponin I (High Sensitivity): 2 ng/L (ref <18)

## 2022-05-15 MED ORDER — NAPROXEN 500 MG PO TABS
500.0000 mg | ORAL_TABLET | Freq: Two times a day (BID) | ORAL | 0 refills | Status: DC
Start: 2022-05-15 — End: 2022-11-12

## 2022-05-15 MED ORDER — KETOROLAC TROMETHAMINE 15 MG/ML IJ SOLN
15.0000 mg | Freq: Once | INTRAMUSCULAR | Status: AC
  Administered 2022-05-15: 15 mg via INTRAVENOUS
  Filled 2022-05-15: qty 1

## 2022-05-15 NOTE — ED Triage Notes (Signed)
Left sided chest pain since yesterday. Describes pain as sharp and constant. Denies radiation.

## 2022-05-15 NOTE — ED Provider Notes (Signed)
[facility] Provider Note   CSN: [id number] Arrival date & time: 05/15/22  1614     History  Chief Complaint  Patient presents with   Chest Pain    Vanessa Carrillo is a 51 y.o. female.  51 year old female with past medical history that is significant for hypertension, diabetes, hyperlipidemia presents today for evaluation of left-sided chest pain ongoing for the past 2 days.  She states up until today her chest pain was intermittent however today it has remained constant.  Chest pain is worse with deep breathing, coughing, and with positional change.  She denies any leg swelling, leg pain, history of DVT or PE, recent long travel, recent surgery.  She denies prior history of CAD, or MI.  Reports history of CAD for both of her parents.  She denies recent illness, or current URI symptoms.  The history is provided by the patient. No language interpreter was used.       Home Medications Prior to Admission medications   Medication Sig Start Date End Date Taking? Authorizing Provider  hydrocortisone 1 % ointment Apply 1 Application topically 2 (two) times daily. Use for one week and off one week 04/21/22   Elwin Mocha, MD  loratadine (CLARITIN) 10 MG tablet Take 1 tablet (10 mg total) by mouth daily. 04/21/22   Elwin Mocha, MD  methocarbamol (ROBAXIN) 500 MG tablet Take 1 tablet (500 mg total) by mouth 2 (two) times daily. Patient not taking: Reported on 04/21/2022 06/09/19   Volanda Napoleon, PA-C  oxyCODONE-acetaminophen (PERCOCET/ROXICET) 5-325 MG tablet Take 1 tablet by mouth every 4 (four) hours as needed for severe pain. Patient not taking: Reported on 04/21/2022 01/19/21   Tegeler, Gwenyth Allegra, MD      Allergies    Patient has no known allergies.    Review of Systems   Review of Systems  Constitutional:  Negative for chills and fever.  Respiratory:  Negative for cough and shortness of breath.   Cardiovascular:  Positive for chest pain.  Negative for palpitations and leg swelling.  Gastrointestinal:  Negative for abdominal pain and nausea.  All other systems reviewed and are negative.   Physical Exam Updated Vital Signs BP (!) 146/78 (BP Location: Right Arm)   Pulse 64   Temp 98 F (36.7 C) (Oral)   Resp 17   Ht '5\' 2"'$  (1.575 m)   Wt 90.3 kg   LMP 04/27/2022 (Approximate)   SpO2 98%   BMI 36.40 kg/m  Physical Exam Vitals and nursing note reviewed.  Constitutional:      General: She is not in acute distress.    Appearance: Normal appearance. She is not ill-appearing.  HENT:     Head: Normocephalic and atraumatic.     Nose: Nose normal.  Eyes:     General: No scleral icterus.    Extraocular Movements: Extraocular movements intact.     Conjunctiva/sclera: Conjunctivae normal.  Cardiovascular:     Rate and Rhythm: Normal rate and regular rhythm.     Pulses: Normal pulses.     Heart sounds:     No friction rub.  Pulmonary:     Effort: Pulmonary effort is normal. No respiratory distress.     Breath sounds: Normal breath sounds. No wheezing or rales.  Abdominal:     General: There is no distension.     Palpations: Abdomen is soft.     Tenderness: There is no abdominal tenderness.  Musculoskeletal:  General: Normal range of motion.     Cervical back: Normal range of motion.     Right lower leg: No edema.     Left lower leg: No edema.  Skin:    General: Skin is warm and dry.  Neurological:     General: No focal deficit present.     Mental Status: She is alert. Mental status is at baseline.     ED Results / Procedures / Treatments   Labs (all labs ordered are listed, but only abnormal results are displayed) Labs Reviewed  BASIC METABOLIC PANEL  CBC  PREGNANCY, URINE  TROPONIN I (HIGH SENSITIVITY)    EKG EKG Interpretation  Date/Time:  Saturday May 15 2022 16:27:39 EDT Ventricular Rate:  61 PR Interval:  155 QRS Duration: 110 QT Interval:  416 QTC Calculation: 419 R  Axis:   89 Text Interpretation: Sinus rhythm No old tracing to compare Confirmed by Aletta Edouard [phone]) on 05/15/2022 4:38:56 PM  Radiology No results found.  Procedures Procedures    Medications Ordered in ED Medications - No data to display  ED Course/ Medical Decision Making/ A&P                           Medical Decision Making Amount and/or Complexity of Data Reviewed Labs: ordered. Radiology: ordered.  Risk Prescription drug management.   Medical Decision Making / ED Course   This patient presents to the ED for concern of chest pain, this involves an extensive number of treatment options, and is a complaint that carries with it a high risk of complications and morbidity.  The differential diagnosis includes ACS, pneumonia, PE, MSK pain, pneumothorax, aortic dissection  MDM: 51 year old female presents today for evaluation of 3-day duration of left-sided chest pain.  Chest pain is positional, pleuritic, and worse with palpation.  She does have history of hypertension, diabetes, hyperlipidemia.  However she states these are all managed through lifestyle changes.  She is not on any medications for any 1 of these.  Recently had her physical and blood work checked.  States good control of her diabetes, and cholesterol.  On chart review her LDL was 109.  A1c of 5.6.  EKG without acute ischemic changes.  Chest x-ray without acute cardiopulmonary process.  CBC is unremarkable.  BMP shows potassium 3.4, glucose 115 otherwise unremarkable.  Initial troponin of 2.  Given symptoms have been ongoing now for 3 days and troponin is normal, EKG is without acute ischemic changes.  Doubt ACS.  Considered pericarditis however given no EKG changes, normal troponin, pain does not improve with sitting up, without appreciable friction rub low suspicion for pericarditis.  Patient does have cough during my exam.  She states she was not aware of cough prior to arriving into the emergency room.  Only 1  episode of cough during her emergency room stay.  Given work-up and patient's presentation most likely costochondritis.  She was given a dose of Toradol.  Did have mild improvement.  Will start patient on naproxen course.  Strict return precautions discussed with patient.  Given her risk factors, and family history for CAD we will provide her a referral to cardiology.  Discussed follow-up with PCP for reevaluation.  I did ambulate patient within the room and she was able to ambulate without shortness of breath, or desaturation.  She is low risk for PE.  Not on OCPs.  She is without tachypnea, tachycardia, or hypoxia.  Doubt PE.  Patient is appropriate for discharge.  Discharged in stable condition.  Return precautions discussed.   Lab Tests: -I ordered, reviewed, and interpreted labs.   The pertinent results include:   Labs Reviewed  BASIC METABOLIC PANEL  CBC  PREGNANCY, URINE  TROPONIN I (HIGH SENSITIVITY)      EKG  EKG Interpretation  Date/Time:  Saturday May 15 2022 16:27:39 EDT Ventricular Rate:  61 PR Interval:  155 QRS Duration: 110 QT Interval:  416 QTC Calculation: 419 R Axis:   89 Text Interpretation: Sinus rhythm No old tracing to compare Confirmed by Aletta Edouard [phone]) on 05/15/2022 4:38:56 PM         Imaging Studies ordered: I ordered imaging studies including chest x-ray I independently visualized and interpreted imaging. I agree with the radiologist interpretation   Medicines ordered and prescription drug management: No orders of the defined types were placed in this encounter.   -I have reviewed the patients home medicines and have made adjustments as needed  Reevaluation: After the interventions noted above, I reevaluated the patient and found that they have :improved  Co morbidities that complicate the patient evaluation History reviewed. No pertinent past medical history.    Dispostion: Patient is appropriate for discharge.  Discharged in  stable condition.  Return precautions discussed.   Final Clinical Impression(s) / ED Diagnoses Final diagnoses:  Atypical chest pain    Rx / DC Orders ED Discharge Orders          Ordered    Ambulatory referral to Cardiology       Comments: If you have not heard from the Cardiology office within the next 72 hours please call [phone].   05/15/22 1915    naproxen (NAPROSYN) 500 MG tablet  2 times daily        05/15/22 1916              Evlyn Courier, PA-C 05/15/22 1916    Hayden Rasmussen, MD 05/16/22 1006

## 2022-05-15 NOTE — Discharge Instructions (Addendum)
Your work-up today was overall reassuring.  Your heart enzyme, EKG did not show any concerning findings.  X-ray did not show any concerning findings.  Feel your most likely cause of chest pain is likely costochondritis which is inflammation surrounding the joint where ribs come into contact with your breastbone.  I have sent in naproxen which is an anti-inflammatory to help.  If you have any worsening symptoms, shortness of breath, chest pain or other concerning symptoms please return to the emergency room for evaluation.  Given your risk factors for heart disease, and your family history of heart disease I have given you a referral to cardiology.  They will call to schedule this appointment if you do not hear from them by Wednesday or Thursday please call to schedule an appointment with them.

## 2022-05-21 ENCOUNTER — Ambulatory Visit
Admission: RE | Admit: 2022-05-21 | Discharge: 2022-05-21 | Disposition: A | Discharge status: Home / Self Care | Payer: Commercial Managed Care - HMO | Source: Ambulatory Visit | Attending: Family Medicine | Admitting: Family Medicine

## 2022-05-21 DIAGNOSIS — Z129 Encounter for screening for malignant neoplasm, site unspecified: Secondary | ICD-10-CM

## 2022-06-24 ENCOUNTER — Ambulatory Visit: Payer: Commercial Managed Care - HMO | Admitting: Internal Medicine

## 2022-11-05 ENCOUNTER — Ambulatory Visit
Admission: RE | Admit: 2022-11-05 | Discharge: 2022-11-05 | Disposition: A | Discharge status: Home / Self Care | Payer: Commercial Managed Care - HMO | Source: Ambulatory Visit | Attending: Family Medicine | Admitting: Family Medicine

## 2022-11-05 ENCOUNTER — Ambulatory Visit: Payer: Medicaid Other | Admitting: Family Medicine

## 2022-11-05 DIAGNOSIS — R0789 Other chest pain: Secondary | ICD-10-CM

## 2022-11-05 DIAGNOSIS — K5900 Constipation, unspecified: Secondary | ICD-10-CM

## 2022-11-05 MED ORDER — POLYETHYLENE GLYCOL 3350 17 GM/SCOOP PO POWD: 17.0000 g | Freq: Three times a day (TID) | ORAL | 0 refills | Status: AC

## 2022-11-05 MED ORDER — BISACODYL 5 MG PO TBEC: 10.0000 mg | DELAYED_RELEASE_TABLET | Freq: Every day | ORAL | 0 refills | Status: DC | PRN

## 2022-11-05 NOTE — Progress Notes (Incomplete)
   Acute Office Visit  Subjective:     Patient ID: Vanessa Carrillo, female    DOB: 03-Dec-1970, 52 y.o.   MRN: [id number]  No chief complaint on file.   HPI Patient is in today for ***  ROS      Objective:    There were no vitals taken for this visit. {Vitals History (Optional):23777}  Physical Exam  No results found for any visits on 11/05/22.      Assessment & Plan:   Problem List Items Addressed This Visit   None Visit Diagnoses     Constipation, unspecified constipation type    -  Primary   Relevant Medications   polyethylene glycol powder (GLYCOLAX/MIRALAX) 17 GM/SCOOP powder   bisacodyl (DULCOLAX) 5 MG EC tablet   Other Relevant Orders   DG Abd 1 View   Atypical chest pain           Meds ordered this encounter  Medications  . polyethylene glycol powder (GLYCOLAX/MIRALAX) 17 GM/SCOOP powder    Sig: Take 17 g by mouth 3 (three) times daily for 3 days.    Dispense:  225 g    Refill:  0  . bisacodyl (DULCOLAX) 5 MG EC tablet    Sig: Take 2 tablets (10 mg total) by mouth daily as needed for moderate constipation.    Dispense:  30 tablet    Refill:  0    No follow-ups on file.  Elwin Mocha, MD

## 2022-11-11 NOTE — Progress Notes (Signed)
Chief Complaint  Patient presents with   New Patient (Initial Visit)    Chest pain   History of Present Illness: 52 yo female with history of chest pain who is here today as a new patient, referred by Dr. Aggie Moats,  for the evaluation of chest pain. She has no known cardiac issues. Her mother had an MI at age 80. Her grandmother had an MI at age 28. She tells me today that she has pain in her lower chest at rest. The pain can occur after meals or when she is sitting around. No clear exertional component. The pain can last up to an hour. No associated dyspnea, dizziness or LE edema. The pain has been occurring 2-3 times per month for the last year.  She has never smoked cigarettes.   Primary Care Physician: Elwin Mocha, MD  Past Medical History:  Diagnosis Date   Back pain    Chest pain    Constipation    Knee pain    Leg pain     Past Surgical History:  Procedure Laterality Date   CESAREAN SECTION     TONSILLECTOMY      Current Outpatient Medications  Medication Sig Dispense Refill   bisacodyl (DULCOLAX) 5 MG EC tablet Take 2 tablets (10 mg total) by mouth daily as needed for moderate constipation. 30 tablet 0   Multiple Vitamin (MULTIVITAMIN) tablet Take 1 tablet by mouth daily.     No current facility-administered medications for this visit.    No Known Allergies  Social History   Socioeconomic History   Marital status: Single    Spouse name: Not on file   Number of children: 2   Years of education: Not on file   Highest education level: Not on file  Occupational History   Occupation: [employer]  Tobacco Use   Smoking status: Never   Smokeless tobacco: Never  Vaping Use   Vaping Use: Never used  Substance and Sexual Activity   Alcohol use: Yes    Comment: occasional   Drug use: Yes    Frequency: 2.0 times per week    Types: Marijuana   Sexual activity: Yes    Birth control/protection: Surgical  Other Topics Concern   Not on file  Social  History Narrative   Not on file   Social Determinants of Health   Financial Resource Strain: Not on file  Food Insecurity: Not on file  Transportation Needs: Not on file  Physical Activity: Not on file  Stress: Not on file  Social Connections: Not on file  Intimate Partner Violence: Not on file    Family History  Problem Relation Age of Onset   Heart disease Mother        MI   Diabetes Mother    CVA Father    Diabetes Father    Diabetes Maternal Grandmother    Congenital heart disease Other     Review of Systems:  As stated in the HPI and otherwise negative.   BP 116/70   Pulse 65   Ht '5\' 2"'$  (1.575 m)   Wt 81.6 kg   SpO2 97%   BMI 32.92 kg/m   Physical Examination: General: Well developed, well nourished, NAD  HEENT: OP clear, mucus membranes moist  SKIN: warm, dry. No rashes. Neuro: No focal deficits  Musculoskeletal: Muscle strength 5/5 all ext  Psychiatric: Mood and affect normal  Neck: No JVD, no carotid bruits, no thyromegaly, no lymphadenopathy.  Lungs:Clear  bilaterally, no wheezes, rhonci, crackles Cardiovascular: Regular rate and rhythm. No murmurs, gallops or rubs. Abdomen:Soft. Bowel sounds present. Non-tender.  Extremities: No lower extremity edema. Pulses are 2 + in the bilateral DP/PT.  EKG:  EKG is ordered today. The ekg ordered today demonstrates NSR  Recent Labs: 04/21/2022: ALT 9; TSH 1.470 05/15/2022: BUN 18; Creatinine, Ser 0.91; Hemoglobin 13.6; Platelets 163; Potassium 3.4; Sodium 138   Lipid Panel    Component Value Date/Time   CHOL 193 04/21/2022 1042   TRIG 91 04/21/2022 1042   HDL 68 04/21/2022 1042   LDLCALC 109 (H) 04/21/2022 1042     Wt Readings from Last 3 Encounters:  11/12/22 81.6 kg  05/15/22 90.3 kg  04/21/22 87.1 kg    Assessment and Plan:   1. Chest pain: Mostly atypical but given her strong family history of premature CAD, will arrange a coronary CTA to exclude CAD and an echo to assess LV function and exclude  structural heart disease. BMET today  Labs/ tests ordered today include:   Orders Placed This Encounter  Procedures   CT CORONARY MORPH W/CTA COR W/SCORE W/CA W/CM &/OR WO/CM   Basic Metabolic Panel (BMET)   EKG 12-Lead   ECHOCARDIOGRAM COMPLETE   Disposition:   F/U with me if needed for abnormal test results   Signed, Lauree Chandler, MD, [facility] 11/12/2022 9:50 AM    [facility] [place], [place], [place]  [address] Phone: [phone]; Fax: [phone]

## 2022-11-12 ENCOUNTER — Encounter: Payer: Self-pay | Admitting: Cardiovascular Disease

## 2022-11-12 ENCOUNTER — Ambulatory Visit: Payer: Commercial Managed Care - HMO | Attending: Cardiovascular Disease | Admitting: Cardiovascular Disease

## 2022-11-12 VITALS — BP 116/70 | HR 65 | Ht 62.0 in | Wt 180.0 lb

## 2022-11-12 DIAGNOSIS — R079 Chest pain, unspecified: Secondary | ICD-10-CM | POA: Diagnosis not present

## 2022-11-12 DIAGNOSIS — Z01812 Encounter for preprocedural laboratory examination: Secondary | ICD-10-CM | POA: Diagnosis not present

## 2022-11-12 DIAGNOSIS — R072 Precordial pain: Secondary | ICD-10-CM | POA: Diagnosis not present

## 2022-11-12 NOTE — Patient Instructions (Addendum)
Medication Instructions:  No changes *If you need a refill on your cardiac medications before your next appointment, please call your pharmacy*   Lab Work: Today: bmet  Testing/Procedures: Your physician has requested that you have an echocardiogram. Echocardiography is a painless test that uses sound waves to create images of your heart. It provides your doctor with information about the size and shape of your heart and how well your heart's chambers and valves are working. This procedure takes approximately one hour. There are no restrictions for this procedure. Please do NOT wear cologne, perfume, aftershave, or lotions (deodorant is allowed). Please arrive 15 minutes prior to your appointment time.  Cardiac CTA - see instructions below   Follow-Up: As needed    Your cardiac CT will be scheduled at one of the below locations:   [facility] [address] [place], [place] [address] [phone]   Please arrive at the [facility] and Children's Entrance (Entrance C2) of [facility] 30 minutes prior to test start time. You can use the FREE valet parking offered at entrance C (encouraged to control the heart rate for the test)  Proceed to the [facility] Radiology Department (first floor) to check-in and test prep.  All radiology patients and guests should use entrance C2 at [facility], accessed from [facility], even though the hospital's physical address listed is [address]      Please follow these instructions carefully (unless otherwise directed):   On the Night Before the Test: Be sure to Drink plenty of water. Do not consume any caffeinated/decaffeinated beverages or chocolate 12 hours prior to your test. Do not take any antihistamines 12 hours prior to your test.   On the Day of the Test: Drink plenty of water until 1 hour prior to the test. Do not eat any food 1 hour prior to test. You may take your regular  medications prior to the test.  FEMALES- please wear underwire-free bra if available, avoid dresses & tight clothing       After the Test: Drink plenty of water. After receiving IV contrast, you may experience a mild flushed feeling. This is normal. On occasion, you may experience a mild rash up to 24 hours after the test. This is not dangerous. If this occurs, you can take Benadryl 25 mg and increase your fluid intake. If you experience trouble breathing, this can be serious. If it is severe call 911 IMMEDIATELY. If it is mild, please call our office. If you take any of these medications: Glipizide/Metformin, Avandament, Glucavance, please do not take 48 hours after completing test unless otherwise instructed.  We will call to schedule your test 2-4 weeks out understanding that some insurance companies will need an authorization prior to the service being performed.   For non-scheduling related questions, please contact the cardiac imaging nurse navigator should you have any questions/concerns: Marchia Bond, Cardiac Imaging Nurse Navigator Gordy Clement, Cardiac Imaging Nurse Navigator [facility] Direct Office Dial: [phone]   For scheduling needs, including cancellations and rescheduling, please call [place], [phone].

## 2022-11-13 ENCOUNTER — Observation Stay (HOSPITAL_BASED_OUTPATIENT_CLINIC_OR_DEPARTMENT_OTHER)
Admission: EM | Admit: 2022-11-13 | Discharge: 2022-11-15 | Disposition: A | Discharge status: Home / Self Care | Payer: Medicaid Other | Attending: Internal Medicine | Admitting: Internal Medicine

## 2022-11-13 ENCOUNTER — Emergency Department (HOSPITAL_BASED_OUTPATIENT_CLINIC_OR_DEPARTMENT_OTHER): Payer: Medicaid Other

## 2022-11-13 ENCOUNTER — Other Ambulatory Visit: Payer: Self-pay

## 2022-11-13 ENCOUNTER — Encounter (HOSPITAL_BASED_OUTPATIENT_CLINIC_OR_DEPARTMENT_OTHER): Payer: Self-pay | Admitting: Emergency Medicine

## 2022-11-13 DIAGNOSIS — K819 Cholecystitis, unspecified: Secondary | ICD-10-CM

## 2022-11-13 DIAGNOSIS — R911 Solitary pulmonary nodule: Secondary | ICD-10-CM | POA: Insufficient documentation

## 2022-11-13 DIAGNOSIS — Z6831 Body mass index (BMI) 31.0-31.9, adult: Secondary | ICD-10-CM | POA: Diagnosis not present

## 2022-11-13 DIAGNOSIS — Z79899 Other long term (current) drug therapy: Secondary | ICD-10-CM | POA: Insufficient documentation

## 2022-11-13 DIAGNOSIS — R1032 Left lower quadrant pain: Secondary | ICD-10-CM | POA: Diagnosis not present

## 2022-11-13 DIAGNOSIS — K8000 Calculus of gallbladder with acute cholecystitis without obstruction: Principal | ICD-10-CM | POA: Diagnosis present

## 2022-11-13 DIAGNOSIS — E876 Hypokalemia: Secondary | ICD-10-CM | POA: Diagnosis present

## 2022-11-13 DIAGNOSIS — E669 Obesity, unspecified: Secondary | ICD-10-CM | POA: Diagnosis present

## 2022-11-13 DIAGNOSIS — K219 Gastro-esophageal reflux disease without esophagitis: Secondary | ICD-10-CM

## 2022-11-13 LAB — COMPREHENSIVE METABOLIC PANEL
ALT: 8 U/L (ref 0–44)
AST: 12 U/L — ABNORMAL LOW (ref 15–41)
Albumin: 3.9 g/dL (ref 3.5–5.0)
Alkaline Phosphatase: 62 U/L (ref 38–126)
Anion gap: 12 (ref 5–15)
BUN: 11 mg/dL (ref 6–20)
CO2: 26 mmol/L (ref 22–32)
Calcium: 9.6 mg/dL (ref 8.9–10.3)
Chloride: 100 mmol/L (ref 98–111)
Creatinine, Ser: 0.67 mg/dL (ref 0.44–1.00)
GFR, Estimated: >60 mL/min (ref >60)
Glucose, Bld: 97 mg/dL (ref 70–99)
Potassium: 2.9 mmol/L — ABNORMAL LOW (ref 3.5–5.1)
Sodium: 138 mmol/L (ref 135–145)
Total Bilirubin: 0.5 mg/dL (ref 0.3–1.2)
Total Protein: 8 g/dL (ref 6.5–8.1)

## 2022-11-13 LAB — CBC WITH DIFFERENTIAL/PLATELET
Abs Immature Granulocytes: 0.03 10*3/uL (ref 0.00–0.07)
Basophils Absolute: 0 10*3/uL (ref 0.0–0.1)
Basophils Relative: 0 %
Eosinophils Absolute: 0.1 10*3/uL (ref 0.0–0.5)
Eosinophils Relative: 1 %
HCT: 42.2 % (ref 36.0–46.0)
Hemoglobin: 14.3 g/dL (ref 12.0–15.0)
Immature Granulocytes: 0 %
Lymphocytes Relative: 33 %
Lymphs Abs: 3.2 10*3/uL (ref 0.7–4.0)
MCH: 31.4 pg (ref 26.0–34.0)
MCHC: 33.9 g/dL (ref 30.0–36.0)
MCV: 92.7 fL (ref 80.0–100.0)
Monocytes Absolute: 1 10*3/uL (ref 0.1–1.0)
Monocytes Relative: 10 %
Neutro Abs: 5.5 10*3/uL (ref 1.7–7.7)
Neutrophils Relative %: 56 %
Platelets: 182 10*3/uL (ref 150–400)
RBC: 4.55 MIL/uL (ref 3.87–5.11)
RDW: 12.7 % (ref 11.5–15.5)
WBC: 9.9 10*3/uL (ref 4.0–10.5)
nRBC: 0 % (ref 0.0–0.2)

## 2022-11-13 LAB — BASIC METABOLIC PANEL
BUN/Creatinine Ratio: 15 (ref 9–23)
BUN: 11 mg/dL (ref 6–24)
CO2: 26 mmol/L (ref 20–29)
Calcium: 9.5 mg/dL (ref 8.7–10.2)
Chloride: 100 mmol/L (ref 96–106)
Creatinine, Ser: 0.72 mg/dL (ref 0.57–1.00)
Glucose: 109 mg/dL — ABNORMAL HIGH (ref 70–99)
Potassium: 3.3 mmol/L — ABNORMAL LOW (ref 3.5–5.2)
Sodium: 143 mmol/L (ref 134–144)
eGFR: 101 mL/min/{1.73_m2} (ref >59)

## 2022-11-13 LAB — LIPASE, BLOOD: Lipase: <10 U/L — ABNORMAL LOW (ref 11–51)

## 2022-11-13 MED ORDER — SODIUM CHLORIDE 0.9% FLUSH
3.0000 mL | Freq: Two times a day (BID) | INTRAVENOUS | Status: DC
  Administered 2022-11-13 – 2022-11-14 (×2): 3 mL via INTRAVENOUS

## 2022-11-13 MED ORDER — ONDANSETRON HCL 4 MG/2ML IJ SOLN
4.0000 mg | Freq: Once | INTRAMUSCULAR | Status: AC
  Administered 2022-11-13: 4 mg via INTRAVENOUS
  Filled 2022-11-13: qty 2

## 2022-11-13 MED ORDER — SODIUM CHLORIDE 0.9 % IV BOLUS
1000.0000 mL | Freq: Once | INTRAVENOUS | Status: AC
  Administered 2022-11-13: 1000 mL via INTRAVENOUS

## 2022-11-13 MED ORDER — ALBUTEROL SULFATE (2.5 MG/3ML) 0.083% IN NEBU: 2.5000 mg | INHALATION_SOLUTION | Freq: Four times a day (QID) | RESPIRATORY_TRACT | Status: DC | PRN

## 2022-11-13 MED ORDER — ACETAMINOPHEN 650 MG RE SUPP: 650.0000 mg | Freq: Four times a day (QID) | RECTAL | Status: DC | PRN

## 2022-11-13 MED ORDER — POTASSIUM CHLORIDE 10 MEQ/100ML IV SOLN
10.0000 meq | Freq: Once | INTRAVENOUS | Status: AC
  Administered 2022-11-13: 10 meq via INTRAVENOUS
  Filled 2022-11-13: qty 100

## 2022-11-13 MED ORDER — ONDANSETRON HCL 4 MG PO TABS: 4.0000 mg | ORAL_TABLET | Freq: Four times a day (QID) | ORAL | Status: DC | PRN

## 2022-11-13 MED ORDER — POTASSIUM CHLORIDE CRYS ER 20 MEQ PO TBCR
40.0000 meq | EXTENDED_RELEASE_TABLET | Freq: Once | ORAL | Status: AC
  Administered 2022-11-13: 40 meq via ORAL
  Filled 2022-11-13: qty 2

## 2022-11-13 MED ORDER — ENOXAPARIN SODIUM 40 MG/0.4ML IJ SOSY
40.0000 mg | PREFILLED_SYRINGE | INTRAMUSCULAR | Status: DC
  Administered 2022-11-13 – 2022-11-14 (×2): 40 mg via SUBCUTANEOUS
  Filled 2022-11-13 (×2): qty 0.4

## 2022-11-13 MED ORDER — ACETAMINOPHEN 325 MG PO TABS: 650.0000 mg | ORAL_TABLET | Freq: Four times a day (QID) | ORAL | Status: DC | PRN

## 2022-11-13 MED ORDER — SIMETHICONE 40 MG/0.6ML PO SUSP (UNIT DOSE)
40.0000 mg | Freq: Once | ORAL | Status: AC
  Administered 2022-11-13: 40 mg via ORAL
  Filled 2022-11-13: qty 0.6

## 2022-11-13 MED ORDER — ONDANSETRON HCL 4 MG/2ML IJ SOLN: 4.0000 mg | Freq: Four times a day (QID) | INTRAMUSCULAR | Status: DC | PRN

## 2022-11-13 MED ORDER — IOHEXOL 300 MG/ML  SOLN
100.0000 mL | Freq: Once | INTRAMUSCULAR | Status: AC | PRN
Start: 2022-11-13 — End: 2022-11-13
  Administered 2022-11-13: 85 mL via INTRAVENOUS

## 2022-11-13 MED ORDER — MORPHINE SULFATE (PF) 2 MG/ML IV SOLN
2.0000 mg | INTRAVENOUS | Status: DC | PRN
  Administered 2022-11-14: 2 mg via INTRAVENOUS
  Filled 2022-11-13: qty 1

## 2022-11-13 MED ORDER — HYDROCODONE-ACETAMINOPHEN 5-325 MG PO TABS: 1.0000 | ORAL_TABLET | Freq: Four times a day (QID) | ORAL | Status: DC | PRN

## 2022-11-13 MED ORDER — SODIUM CHLORIDE 0.9 % IV SOLN: INTRAVENOUS | Status: DC

## 2022-11-13 NOTE — ED Triage Notes (Signed)
Pt reports abdominal pain with n/v/d x 2 weeks. Saw her PCP and treated her for constipation. Pt reports no relief with treatment. Denies fevers.

## 2022-11-13 NOTE — H&P (Addendum)
History and Physical    Patient: Vanessa Carrillo [id number] DOB: 1971/08/08 DOA: 11/13/2022 DOS: the patient was seen and examined on 11/13/2022 PCP: Elwin Mocha, MD  Patient coming from: Transfer fro [place]  Chief Complaint:  Chief Complaint  Patient presents with   Abdominal Pain   HPI: Vanessa Carrillo is a 52 y.o. female with medical history significant of constipation who presented with complaints of abdominal pain present over the last 2 weeks.  Patient states symptoms initially started after eating a [employer] salad .  She reports having a sharp stabbing pain that waxes and wanes in intensity, but also has a constant dull pain that is present.  Pain was initially located in the right upper quadrant of her abdomen, but had recently moved down to the left lower quadrant.  She reported having associated symptoms of nausea, vomiting, and constipation.  Emesis has been nonbloody.  She had went to her primary care provider on 2/23 due to the symptoms and had been prescribed bisacodyl and polyethylene glycol that helped with constipation, but not with the abdominal pain.  At home she had been taking ibuprofen with only mild relief in symptoms.    In the emergency department patient was found to have stable vital signs.  Labs significant for potassium of 2.9.  Patient had been given 1 L normal saline IV fluids, a total of 50 mEq of potassium chloride, Zofran, and simethicone.  CT scan of the abdomen pelvis noted marked circumferential gallbladder wall thickening with 13 mm calcified gallstone towards the fundus with findings concerning for acute cholecystitis and punctate nonobstructing stone in the upper pole of the left kidney.Marland Kitchen  Ultrasound also noted abnormal gallbladder with wall thickening and stones with findings suggestive of acute cholecystitis.  General surgery had been consulted but recommended a HIDA scan prior to Review of Systems: As mentioned in the history of present illness.  All other systems reviewed and are negative. Past Medical History:  Diagnosis Date   Back pain    Chest pain    Constipation    Knee pain    Leg pain    Past Surgical History:  Procedure Laterality Date   CESAREAN SECTION     TONSILLECTOMY     Social History:  reports that she has never smoked. She has never used smokeless tobacco. She reports current alcohol use. She reports current drug use. Frequency: 2.00 times per week. Drug: Marijuana.  No Known Allergies  Family History  Problem Relation Age of Onset   Heart disease Mother        MI   Diabetes Mother    CVA Father    Diabetes Father    Diabetes Maternal Grandmother    Congenital heart disease Other     Prior to Admission medications   Medication Sig Start Date End Date Taking? Authorizing Provider  bisacodyl (DULCOLAX) 5 MG EC tablet Take 2 tablets (10 mg total) by mouth daily as needed for moderate constipation. 11/05/22   Elwin Mocha, MD  Multiple Vitamin (MULTIVITAMIN) tablet Take 1 tablet by mouth daily.    [provider]    Physical Exam: Vitals:   11/13/22 1016 11/13/22 1230 11/13/22 1300 11/13/22 1424  BP:  115/83 (!) 129/90   Pulse:      Resp:      Temp:    98.2 F (36.8 C)  TempSrc:    Oral  SpO2:      Weight: 78.9 kg     Height:  $'5\' 2"'a$  (1.575 m)       Constitutional: Middle-aged female who appears to be in some discomfort Eyes: PERRL, lids and conjunctivae normal ENMT: Mucous membranes are moist.   Neck: normal, supple  Respiratory: clear to auscultation bilaterally, no wheezing, no crackles. Normal respiratory effort. No accessory muscle use.  Cardiovascular: Regular rate and rhythm, no murmurs / rubs / gallops. No extremity edema. 2+ pedal pulses. No carotid bruits.  Abdomen: Mild right upper quadrant tenderness present, but also left lower quadrant tenderness to palpation.  Bowel sounds present. Musculoskeletal: no clubbing / cyanosis. No joint deformity upper and lower  extremities. Good ROM, no contractures. Normal muscle tone.  Skin: no rashes, lesions, ulcers. No induration Neurologic: CN 2-12 grossly intact.  Strength 5/5 in all 4.  Psychiatric: Normal judgment and insight. Alert and oriented x 3. Normal mood.   Data Reviewed:  Reviewed labs, imaging, pertinent records as noted above in HPI.  Assessment and Plan: Cholelithiasis with acute cholecystitis Acute.  Patient presents with complaints of abdominal pain over the last 2 weeks that initially started in the right upper quadrant, but thereafter went to the left lower quadrant.  CT scan of the abdomen pelvis was obtained which noted concern for acute cholecystitis with 13 mm gallstone towards the fundus concerning for acute cholecystitis.  Ultrasound of the abdomen and pelvis noted abnormal gallbladder wall thickening with findings suggestive of acute cholecystitis. -Admit to a MedSurg bed -Check HIDA scan stat -Normal saline IV fluids at 75 mL-Hold pain medication for now until after HIDA scan -General surgery consult, will follow-up for any further recommendations  Hypokalemia Acute.  Initial potassium 2.9.  Patient has been given a total of 50 mEq of potassium chloride p.o. suspect related to patient's reports of nausea and vomiting. -Continue to monitor and replace as needed  Left lower quadrant pain Acute.  Unclear cause of patient's symptoms less possibly related to the kidneys known noted on the CT as there is no signs of diverticulitis noted. -Follow-up urinalysis  Obesity  BMI 31.83 kg/m  DVT prophylaxis: Lovenox Advance Care Planning:   Code Status: Full Code    Consults: General surgery  Family Communication: None  Severity of Illness: The appropriate patient status for this patient is OBSERVATION. Observation status is judged to be reasonable and necessary in order to provide the required intensity of service to ensure the patient's safety. The patient's presenting symptoms,  physical exam findings, and initial radiographic and laboratory data in the context of their medical condition is felt to place them at decreased risk for further clinical deterioration. Furthermore, it is anticipated that the patient will be medically stable for discharge from the hospital within 2 midnights of admission.   Author: Norval Morton, MD 11/13/2022 3:36 PM  For on call review [email or web address].

## 2022-11-13 NOTE — Consult Note (Addendum)
CC/Reason for consult: Left lower quadrant pain, discordant imaging findings  Requesting physician: Dr. Fuller Plan, MD  HPI: Vanessa Carrillo is an 52 y.o. female with hx of constipation presented to [facility] with 2 week history of left lower quadrant pain, nausea/vomiting and constipation. She reports evaluation with her PCP and initiating laxatives. Last BM 4d ago. Pain is crampy type sensation in her LLQ with sharp shooting radiation intermittently. She has also had MEG pain previously that has since improved.  No fever/chills Never had this kind of pain before  Seeing cardiology for atypical chest pain - had echo ordered yesterday and plans for cardiac CT.  Past Medical History:  Diagnosis Date   Back pain    Chest pain    Constipation    Knee pain    Leg pain     Past Surgical History:  Procedure Laterality Date   CESAREAN SECTION     TONSILLECTOMY      Family History  Problem Relation Age of Onset   Heart disease Mother        MI   Diabetes Mother    CVA Father    Diabetes Father    Diabetes Maternal Grandmother    Congenital heart disease Other     Social:  reports that she has never smoked. She has never used smokeless tobacco. She reports current alcohol use. She reports current drug use. Frequency: 2.00 times per week. Drug: Marijuana.  Allergies: No Known Allergies  Medications: I have reviewed the patient's current medications.  Results for orders placed or performed during the hospital encounter of 11/13/22 (from the past 48 hour(s))  CBC with Differential     Status: None   Collection Time: 11/13/22  9:55 AM  Result Value Ref Range   WBC 9.9 4.0 - 10.5 K/uL   RBC 4.55 3.87 - 5.11 MIL/uL   Hemoglobin 14.3 12.0 - 15.0 g/dL   HCT 42.2 36.0 - 46.0 %   MCV 92.7 80.0 - 100.0 fL   MCH 31.4 26.0 - 34.0 pg   MCHC 33.9 30.0 - 36.0 g/dL   RDW 12.7 11.5 - 15.5 %   Platelets 182 150 - 400 K/uL   nRBC 0.0 0.0 - 0.2 %   Neutrophils Relative % 56 %    Neutro Abs 5.5 1.7 - 7.7 K/uL   Lymphocytes Relative 33 %   Lymphs Abs 3.2 0.7 - 4.0 K/uL   Monocytes Relative 10 %   Monocytes Absolute 1.0 0.1 - 1.0 K/uL   Eosinophils Relative 1 %   Eosinophils Absolute 0.1 0.0 - 0.5 K/uL   Basophils Relative 0 %   Basophils Absolute 0.0 0.0 - 0.1 K/uL   Immature Granulocytes 0 %   Abs Immature Granulocytes 0.03 0.00 - 0.07 K/uL    Comment: Performed at [facility], [place], [place] [address]  Comprehensive metabolic panel     Status: Abnormal   Collection Time: 11/13/22  9:55 AM  Result Value Ref Range   Sodium 138 135 - 145 mmol/L   Potassium 2.9 (L) 3.5 - 5.1 mmol/L   Chloride 100 98 - 111 mmol/L   CO2 26 22 - 32 mmol/L   Glucose, Bld 97 70 - 99 mg/dL    Comment: Glucose reference range applies only to samples taken after fasting for at least 8 hours.   BUN 11 6 - 20 mg/dL   Creatinine, Ser 0.67 0.44 - 1.00 mg/dL   Calcium 9.6 8.9 -  10.3 mg/dL   Total Protein 8.0 6.5 - 8.1 g/dL   Albumin 3.9 3.5 - 5.0 g/dL   AST 12 (L) 15 - 41 U/L   ALT 8 0 - 44 U/L   Alkaline Phosphatase 62 38 - 126 U/L   Total Bilirubin 0.5 0.3 - 1.2 mg/dL   GFR, Estimated >60 >60 mL/min    Comment: (NOTE) Calculated using the CKD-EPI Creatinine Equation (2021)    Anion gap 12 5 - 15    Comment: Performed at [facility], [place], [place] [address]  Lipase, blood     Status: Abnormal   Collection Time: 11/13/22  9:55 AM  Result Value Ref Range   Lipase <10 (L) 11 - 51 U/L    Comment: Performed at [facility], [address], [place], [place] [address]    US Abdomen Limited RUQ (LIVER/GB)  Result Date: 11/13/2022 CLINICAL DATA:  [id number] Pain [id number] EXAM: ULTRASOUND ABDOMEN LIMITED COMPARISON:  None Available. FINDINGS: The liver demonstrates normal parenchymal echogenicity and homogeneous texture without focal hepatic parenchymal lesions or intrahepatic ductal dilatation.  Hepatopetal portal vein. Gallbladder demonstrates numerous shadowing stones and evidence of wall thickening suggestive of cholecystitis. CBD measured 0.5cm. Imaged portions of the pancreas were unremarkable. IMPRESSION: Abnormal gallbladder with wall thickening and stones. Findings suggest acute cholecystitis. Electronically Signed   By: Sammie Bench M.D.   On: 11/13/2022 12:21   CT Abdomen Pelvis W Contrast  Result Date: 11/13/2022 CLINICAL DATA:  Left lower quadrant abdominal pain with nausea vomiting and diarrhea. EXAM: CT ABDOMEN AND PELVIS WITH CONTRAST TECHNIQUE: Multidetector CT imaging of the abdomen and pelvis was performed using the standard protocol following bolus administration of intravenous contrast. RADIATION DOSE REDUCTION: This exam was performed according to the departmental dose-optimization program which includes automated exposure control, adjustment of the mA and/or kV according to patient size and/or use of iterative reconstruction technique. CONTRAST:  33m OMNIPAQUE IOHEXOL 300 MG/ML  SOLN COMPARISON:  None Available. FINDINGS: Lower chest: 4 mm right lower lobe subpleural nodule visible on image 7/series 4. Hepatobiliary: No suspicious focal abnormality within the liver parenchyma. No intrahepatic biliary duct dilatation. Marked circumferential gallbladder wall thickening noted with 13 mm calcified gallstone towards the fundus. Narrowing of the gallbladder fundus may be secondary to adenomyomatosis. Common bile duct diameter upper normal at 6 mm. Pancreas: No focal mass lesion. No dilatation of the main duct. No intraparenchymal cyst. No peripancreatic edema. Spleen: No splenomegaly. No focal mass lesion. Adrenals/Urinary Tract: No adrenal nodule or mass. Punctate nonobstructing stone noted upper pole left kidney. Right kidney appears mildly atrophic. No hydroureteronephrosis. The urinary bladder appears normal for the degree of distention. Stomach/Bowel: Stomach is unremarkable. No  gastric wall thickening. No evidence of outlet obstruction. Duodenum is normally positioned as is the ligament of Treitz. No small bowel wall thickening. No small bowel dilatation. The terminal ileum is normal. The appendix is normal. No gross colonic mass. No colonic wall thickening. Vascular/Lymphatic: There is mild atherosclerotic calcification of the abdominal aorta without aneurysm. There is no gastrohepatic or hepatoduodenal ligament lymphadenopathy. No retroperitoneal or mesenteric lymphadenopathy. No pelvic sidewall lymphadenopathy. Reproductive: Unremarkable. Other: No intraperitoneal free fluid. Musculoskeletal: No worrisome lytic or sclerotic osseous abnormality. IMPRESSION: 1. Marked circumferential gallbladder wall thickening with 13 mm calcified gallstone towards the fundus. Imaging features are concerning for acute cholecystitis. Possible adenomyomatosis at the gallbladder fundus. 2. Punctate nonobstructing stone upper pole left kidney. 3. 4 mm right lower lobe subpleural nodule. No follow-up  needed if patient is low-risk.This recommendation follows the consensus statement: Guidelines for Management of Incidental Pulmonary Nodules Detected on CT Images: From the Fleischner Society 2017; Radiology 2017; 284:228-243. 4.  Aortic Atherosclerosis (ICD10-I70.0). Electronically Signed   By: Misty Stanley M.D.   On: 11/13/2022 11:39    ROS - all of the below systems have been reviewed with the patient and positives are indicated with bold text General: chills, fever or night sweats Eyes: blurry vision or double vision ENT: epistaxis or sore throat Allergy/Immunology: itchy/watery eyes or nasal congestion Hematologic/Lymphatic: bleeding problems, blood clots or swollen lymph nodes Endocrine: temperature intolerance or unexpected weight changes Breast: new or changing breast lumps or nipple discharge Resp: cough, shortness of breath, or wheezing CV: chest pain or dyspnea on exertion GI: as per  HPI GU: dysuria, trouble voiding, or hematuria MSK: joint pain or joint stiffness Neuro: TIA or stroke symptoms Derm: pruritus and skin lesion changes Psych: anxiety and depression  PE Blood pressure (!) 129/90, pulse 74, temperature 98.2 F (36.8 C), temperature source Oral, resp. rate 18, height '5\' 2"'$  (1.575 m), weight 78.9 kg, SpO2 100 %. Constitutional: NAD; conversant Eyes: Moist conjunctiva; anicteric Lungs: Normal respiratory effort CV: RRR GI: Abd soft, no significant tenderness in any quadrant even with deep palpation; nondistended MSK: Normal range of motion of extremities Psychiatric: Appropriate affect  Results for orders placed or performed during the hospital encounter of 11/13/22 (from the past 48 hour(s))  CBC with Differential     Status: None   Collection Time: 11/13/22  9:55 AM  Result Value Ref Range   WBC 9.9 4.0 - 10.5 K/uL   RBC 4.55 3.87 - 5.11 MIL/uL   Hemoglobin 14.3 12.0 - 15.0 g/dL   HCT 42.2 36.0 - 46.0 %   MCV 92.7 80.0 - 100.0 fL   MCH 31.4 26.0 - 34.0 pg   MCHC 33.9 30.0 - 36.0 g/dL   RDW 12.7 11.5 - 15.5 %   Platelets 182 150 - 400 K/uL   nRBC 0.0 0.0 - 0.2 %   Neutrophils Relative % 56 %   Neutro Abs 5.5 1.7 - 7.7 K/uL   Lymphocytes Relative 33 %   Lymphs Abs 3.2 0.7 - 4.0 K/uL   Monocytes Relative 10 %   Monocytes Absolute 1.0 0.1 - 1.0 K/uL   Eosinophils Relative 1 %   Eosinophils Absolute 0.1 0.0 - 0.5 K/uL   Basophils Relative 0 %   Basophils Absolute 0.0 0.0 - 0.1 K/uL   Immature Granulocytes 0 %   Abs Immature Granulocytes 0.03 0.00 - 0.07 K/uL    Comment: Performed at [facility], [place], [place] [address]  Comprehensive metabolic panel     Status: Abnormal   Collection Time: 11/13/22  9:55 AM  Result Value Ref Range   Sodium 138 135 - 145 mmol/L   Potassium 2.9 (L) 3.5 - 5.1 mmol/L   Chloride 100 98 - 111 mmol/L   CO2 26 22 - 32 mmol/L   Glucose, Bld 97 70 - 99 mg/dL    Comment:  Glucose reference range applies only to samples taken after fasting for at least 8 hours.   BUN 11 6 - 20 mg/dL   Creatinine, Ser 0.67 0.44 - 1.00 mg/dL   Calcium 9.6 8.9 - 10.3 mg/dL   Total Protein 8.0 6.5 - 8.1 g/dL   Albumin 3.9 3.5 - 5.0 g/dL   AST 12 (L) 15 - 41 U/L   ALT  8 0 - 44 U/L   Alkaline Phosphatase 62 38 - 126 U/L   Total Bilirubin 0.5 0.3 - 1.2 mg/dL   GFR, Estimated >60 >60 mL/min    Comment: (NOTE) Calculated using the CKD-EPI Creatinine Equation (2021)    Anion gap 12 5 - 15    Comment: Performed at [facility], [place], [place] [address]  Lipase, blood     Status: Abnormal   Collection Time: 11/13/22  9:55 AM  Result Value Ref Range   Lipase <10 (L) 11 - 51 U/L    Comment: Performed at [facility], [place], [place] [address]    US Abdomen Limited RUQ (LIVER/GB)  Result Date: 11/13/2022 CLINICAL DATA:  [id number] Pain [id number] EXAM: ULTRASOUND ABDOMEN LIMITED COMPARISON:  None Available. FINDINGS: The liver demonstrates normal parenchymal echogenicity and homogeneous texture without focal hepatic parenchymal lesions or intrahepatic ductal dilatation. Hepatopetal portal vein. Gallbladder demonstrates numerous shadowing stones and evidence of wall thickening suggestive of cholecystitis. CBD measured 0.5cm. Imaged portions of the pancreas were unremarkable. IMPRESSION: Abnormal gallbladder with wall thickening and stones. Findings suggest acute cholecystitis. Electronically Signed   By: Sammie Bench M.D.   On: 11/13/2022 12:21   CT Abdomen Pelvis W Contrast  Result Date: 11/13/2022 CLINICAL DATA:  Left lower quadrant abdominal pain with nausea vomiting and diarrhea. EXAM: CT ABDOMEN AND PELVIS WITH CONTRAST TECHNIQUE: Multidetector CT imaging of the abdomen and pelvis was performed using the standard protocol following bolus administration of intravenous contrast. RADIATION DOSE REDUCTION: This exam was  performed according to the departmental dose-optimization program which includes automated exposure control, adjustment of the mA and/or kV according to patient size and/or use of iterative reconstruction technique. CONTRAST:  41m OMNIPAQUE IOHEXOL 300 MG/ML  SOLN COMPARISON:  None Available. FINDINGS: Lower chest: 4 mm right lower lobe subpleural nodule visible on image 7/series 4. Hepatobiliary: No suspicious focal abnormality within the liver parenchyma. No intrahepatic biliary duct dilatation. Marked circumferential gallbladder wall thickening noted with 13 mm calcified gallstone towards the fundus. Narrowing of the gallbladder fundus may be secondary to adenomyomatosis. Common bile duct diameter upper normal at 6 mm. Pancreas: No focal mass lesion. No dilatation of the main duct. No intraparenchymal cyst. No peripancreatic edema. Spleen: No splenomegaly. No focal mass lesion. Adrenals/Urinary Tract: No adrenal nodule or mass. Punctate nonobstructing stone noted upper pole left kidney. Right kidney appears mildly atrophic. No hydroureteronephrosis. The urinary bladder appears normal for the degree of distention. Stomach/Bowel: Stomach is unremarkable. No gastric wall thickening. No evidence of outlet obstruction. Duodenum is normally positioned as is the ligament of Treitz. No small bowel wall thickening. No small bowel dilatation. The terminal ileum is normal. The appendix is normal. No gross colonic mass. No colonic wall thickening. Vascular/Lymphatic: There is mild atherosclerotic calcification of the abdominal aorta without aneurysm. There is no gastrohepatic or hepatoduodenal ligament lymphadenopathy. No retroperitoneal or mesenteric lymphadenopathy. No pelvic sidewall lymphadenopathy. Reproductive: Unremarkable. Other: No intraperitoneal free fluid. Musculoskeletal: No worrisome lytic or sclerotic osseous abnormality. IMPRESSION: 1. Marked circumferential gallbladder wall thickening with 13 mm calcified  gallstone towards the fundus. Imaging features are concerning for acute cholecystitis. Possible adenomyomatosis at the gallbladder fundus. 2. Punctate nonobstructing stone upper pole left kidney. 3. 4 mm right lower lobe subpleural nodule. No follow-up needed if patient is low-risk.This recommendation follows the consensus statement: Guidelines for Management of Incidental Pulmonary Nodules Detected on CT Images: From the Fleischner Society 2017; Radiology 2017; 284:228-243. 4.  Aortic Atherosclerosis (ICD10-I70.0). Electronically Signed   By: Misty Stanley M.D.   On: 11/13/2022 11:39    I have personally reviewed the relevant RUQ [place], CT A/P, CBC (nml), CMP (nml), Lipase (nml); I have discussed her case with Dr. Tamala Julian as well as with PA at [facility]  A/P: WAJEEHA ALSBURY is an 52 y.o. female with possible cholecystitis  -No fever, no tenderness on exam, no elevated WBC; does have fairly impressive wall thickening of the gallbladder on CT and [place]; no pericholecystic fluid but does have gallstones. Has had symptoms now for 2 weeks without any real progression nor resolution -Equivocal and would favor HIDA scan for further evaluation -Empiric IV abx to cover for cholecystitis -We will follow with you; NPO after midnight in hopes that HIDA will happen Sunday.  -She has recent eval (yesterday) for atypical chest pain and strong family hx of premature cardiac disease - cardiology is working this up. Would see if we can expedite this while in house for clearance purposes  -We spent time reviewing the relevant anatomy and physiology of the gallbladder with her this evening. We discussed her workup to date and plans for further evaluation with HIDA scan. She expressed understanding of all the above and agreement with the plan.  I spent a total of 60 minutes in both face-to-face and non-face-to-face activities, excluding procedures performed, for this visit on the date of this  encounter.  Nadeen Landau, [facility], [place]

## 2022-11-13 NOTE — ED Notes (Signed)
Clavin at [facility] on the way eta.20-25 mins. Will get report from Nurse at that time.-ABB(NS)

## 2022-11-13 NOTE — Progress Notes (Signed)
Pt scheduled for HIDA scan. Schedule not yet confirmed at this time. Per MD, pt not to have pain meds prior to the scan

## 2022-11-13 NOTE — ED Provider Notes (Signed)
[facility] Provider Note   CSN: [id number] Arrival date & time: 11/13/22  [id number]     History  Chief Complaint  Patient presents with   Abdominal Pain    Vanessa Carrillo is a 52 y.o. female.  With a history of constipation, C-section who presents to the ED for evaluation of left lower quadrant abdominal pain, nausea and vomiting.  Symptoms initially began 2 weeks ago.  She saw her primary care provider who started her on laxatives.  She has been able to have normal bowel movements when she takes laxatives.  She states she did not take the laxatives yesterday and was unable to have a bowel movement.  Has not had a bowel movement today either.  She feels abdominal bloating and has been passing a lot of gas.  She reports having a bowel movement 4 days ago and then wiping and seeing blood on the toilet paper.  There was no blood in the stool or in the toilet.  Currently describes the pain as a cramping sensation with intermittent sharp and shooting pains.  Rates it at an 8 out of 10.  Denies fevers.  She took ibuprofen with moderate relief yesterday.  Took Protonix today with no improvement in her symptoms.  Denies history of diverticulitis.  She has had 2 episodes of nonbloody emesis over the last week.  Denies urinary symptoms including dysuria, frequency, urgency.  Also denies vaginal symptoms and pelvic pain.   Abdominal Pain Associated symptoms: nausea and vomiting        Home Medications Prior to Admission medications   Medication Sig Start Date End Date Taking? Authorizing Provider  bisacodyl (DULCOLAX) 5 MG EC tablet Take 2 tablets (10 mg total) by mouth daily as needed for moderate constipation. 11/05/22   Elwin Mocha, MD  Multiple Vitamin (MULTIVITAMIN) tablet Take 1 tablet by mouth daily.    [provider]      Allergies    Patient has no known allergies.    Review of Systems   Review of Systems  Gastrointestinal:   Positive for abdominal pain, nausea and vomiting.  All other systems reviewed and are negative.   Physical Exam Updated Vital Signs BP (!) 129/90   Pulse 74   Temp 98.2 F (36.8 C) (Oral)   Resp 18   Ht '5\' 2"'$  (1.575 m)   Wt 78.9 kg   SpO2 100%   BMI 31.83 kg/m  Physical Exam Vitals and nursing note reviewed.  Constitutional:      General: She is not in acute distress.    Appearance: She is well-developed. She is obese. She is not ill-appearing, toxic-appearing or diaphoretic.  HENT:     Head: Normocephalic and atraumatic.  Eyes:     Conjunctiva/sclera: Conjunctivae normal.  Cardiovascular:     Rate and Rhythm: Normal rate and regular rhythm.     Heart sounds: No murmur heard. Pulmonary:     Effort: Pulmonary effort is normal. No respiratory distress.     Breath sounds: Normal breath sounds.  Abdominal:     Palpations: Abdomen is soft.     Tenderness: There is abdominal tenderness in the left upper quadrant and left lower quadrant. There is no guarding. Negative signs include Murphy's sign, McBurney's sign, psoas sign and obturator sign.  Musculoskeletal:        General: No swelling.     Cervical back: Neck supple.  Skin:    General: Skin is warm and  dry.     Capillary Refill: Capillary refill takes less than 2 seconds.  Neurological:     Mental Status: She is alert.  Psychiatric:        Mood and Affect: Mood normal.     ED Results / Procedures / Treatments   Labs (all labs ordered are listed, but only abnormal results are displayed) Labs Reviewed  COMPREHENSIVE METABOLIC PANEL - Abnormal; Notable for the following components:      Result Value   Potassium 2.9 (*)    AST 12 (*)    All other components within normal limits  LIPASE, BLOOD - Abnormal; Notable for the following components:   Lipase <10 (*)    All other components within normal limits  CBC WITH DIFFERENTIAL/PLATELET  URINALYSIS, ROUTINE W REFLEX MICROSCOPIC    EKG None  Radiology US  Abdomen Limited RUQ (LIVER/GB)  Result Date: 11/13/2022 CLINICAL DATA:  [id number] Pain [id number] EXAM: ULTRASOUND ABDOMEN LIMITED COMPARISON:  None Available. FINDINGS: The liver demonstrates normal parenchymal echogenicity and homogeneous texture without focal hepatic parenchymal lesions or intrahepatic ductal dilatation. Hepatopetal portal vein. Gallbladder demonstrates numerous shadowing stones and evidence of wall thickening suggestive of cholecystitis. CBD measured 0.5cm. Imaged portions of the pancreas were unremarkable. IMPRESSION: Abnormal gallbladder with wall thickening and stones. Findings suggest acute cholecystitis. Electronically Signed   By: Sammie Bench M.D.   On: 11/13/2022 12:21   CT Abdomen Pelvis W Contrast  Result Date: 11/13/2022 CLINICAL DATA:  Left lower quadrant abdominal pain with nausea vomiting and diarrhea. EXAM: CT ABDOMEN AND PELVIS WITH CONTRAST TECHNIQUE: Multidetector CT imaging of the abdomen and pelvis was performed using the standard protocol following bolus administration of intravenous contrast. RADIATION DOSE REDUCTION: This exam was performed according to the departmental dose-optimization program which includes automated exposure control, adjustment of the mA and/or kV according to patient size and/or use of iterative reconstruction technique. CONTRAST:  88m OMNIPAQUE IOHEXOL 300 MG/ML  SOLN COMPARISON:  None Available. FINDINGS: Lower chest: 4 mm right lower lobe subpleural nodule visible on image 7/series 4. Hepatobiliary: No suspicious focal abnormality within the liver parenchyma. No intrahepatic biliary duct dilatation. Marked circumferential gallbladder wall thickening noted with 13 mm calcified gallstone towards the fundus. Narrowing of the gallbladder fundus may be secondary to adenomyomatosis. Common bile duct diameter upper normal at 6 mm. Pancreas: No focal mass lesion. No dilatation of the main duct. No intraparenchymal cyst. No peripancreatic edema. Spleen:  No splenomegaly. No focal mass lesion. Adrenals/Urinary Tract: No adrenal nodule or mass. Punctate nonobstructing stone noted upper pole left kidney. Right kidney appears mildly atrophic. No hydroureteronephrosis. The urinary bladder appears normal for the degree of distention. Stomach/Bowel: Stomach is unremarkable. No gastric wall thickening. No evidence of outlet obstruction. Duodenum is normally positioned as is the ligament of Treitz. No small bowel wall thickening. No small bowel dilatation. The terminal ileum is normal. The appendix is normal. No gross colonic mass. No colonic wall thickening. Vascular/Lymphatic: There is mild atherosclerotic calcification of the abdominal aorta without aneurysm. There is no gastrohepatic or hepatoduodenal ligament lymphadenopathy. No retroperitoneal or mesenteric lymphadenopathy. No pelvic sidewall lymphadenopathy. Reproductive: Unremarkable. Other: No intraperitoneal free fluid. Musculoskeletal: No worrisome lytic or sclerotic osseous abnormality. IMPRESSION: 1. Marked circumferential gallbladder wall thickening with 13 mm calcified gallstone towards the fundus. Imaging features are concerning for acute cholecystitis. Possible adenomyomatosis at the gallbladder fundus. 2. Punctate nonobstructing stone upper pole left kidney. 3. 4 mm right lower lobe subpleural nodule. No follow-up needed if patient is  low-risk.This recommendation follows the consensus statement: Guidelines for Management of Incidental Pulmonary Nodules Detected on CT Images: From the Fleischner Society 2017; Radiology 2017; 284:228-243. 4.  Aortic Atherosclerosis (ICD10-I70.0). Electronically Signed   By: Misty Stanley M.D.   On: 11/13/2022 11:39    Procedures Procedures    Medications Ordered in ED Medications  potassium chloride 10 mEq in 100 mL IVPB (has no administration in time range)  potassium chloride SA (KLOR-CON M) CR tablet 40 mEq (has no administration in time range)  sodium chloride  0.9 % bolus 1,000 mL (0 mLs Intravenous Stopped 11/13/22 1244)  ondansetron (ZOFRAN) injection 4 mg (4 mg Intravenous Given 11/13/22 1015)  simethicone (MYLICON) 40 0000000 suspension 40 mg (40 mg Oral Given 11/13/22 1015)  iohexol (OMNIPAQUE) 300 MG/ML solution 100 mL (85 mLs Intravenous Contrast Given 11/13/22 1120)    ED Course/ Medical Decision Making/ A&P Clinical Course as of 11/13/22 1432  Sat Nov 13, 2022  1302 Spoke with general surgery.  He recommends HIDA scan to definitively rule in or rule out cholecystitis.  Unable to perform this at [place].  Will contact radiology to see if they perform these tests in the ED and on weekends.  Likely need admission to hospitalist with inpatient HIDA scan ordered [AS]  1347 Spoke with hospitalist Dr. Trilby Drummer who will admit patient [AS]    Clinical Course User Index [AS] Arvilla Salada, Grafton Folk, PA-C                             Medical Decision Making Amount and/or Complexity of Data Reviewed Labs: ordered. Radiology: ordered.  Risk Prescription drug management. Decision regarding hospitalization.  This patient presents to the ED for concern of abdominal pain, this involves an extensive number of treatment options, and is a complaint that carries with it a high risk of complications and morbidity.  The differential diagnosis for generalized abdominal pain includes, but is not limited to AAA, gastroenteritis, appendicitis, Bowel obstruction, Bowel perforation. Gastroparesis, DKA, Hernia, Inflammatory bowel disease, mesenteric ischemia, pancreatitis, peritonitis SBP, volvulus.   Co morbidities that complicate the patient evaluation  constipation  My initial workup includes abdominal pain labs, CT abdomen pelvis, simethicone, IV fluids, IV Zofran  Additional history obtained from: Nursing notes from this visit.  I ordered, reviewed and interpreted labs which include: CBC, CMP, urinalysis, lipase.  Hypokalemia of 2.9 consistent with GI loss and  malnutrition.  Labs otherwise unremarkable  I ordered imaging studies including CT abdomen pelvis, right upper quadrant ultrasound I independently visualized and interpreted imaging which showed both CT and ultrasound consistent with cholecystitis I agree with the radiologist interpretation  Afebrile, hemodynamically stable.  52 year old female presents the ED for evaluation of left lower quadrant abdominal pain.  She has some left lower quadrant abdominal tenderness to palpation.  Her exam is otherwise unremarkable.  CT did show signs of cholecystitis.  She does not have any right upper quadrant abdominal pain.  Negative Murphy sign.  Right upper quadrant ultrasound confirmed radiographic findings of cholecystitis.  She does not have a fever.  No leukocytosis.  Normal LFTs.  Consulted with general surgery who recommends HIDA scan for further evaluation.  Cannot be done in the ED.  Patient will be admitted to hospitalist for HIDA scan and general surgery consult pending results.  Stable at the time of admission.  Patient's case discussed with Dr. Armandina Gemma who agrees with plan to admit.   Note: Portions of this  report may have been transcribed using voice recognition software. Every effort was made to ensure accuracy; however, inadvertent computerized transcription errors may still be present.        Final Clinical Impression(s) / ED Diagnoses Final diagnoses:  Cholecystitis    Rx / DC Orders ED Discharge Orders     None         Roylene Reason, Hershal Coria 11/13/22 1433    Regan Lemming, MD 11/14/22 [phone]

## 2022-11-14 ENCOUNTER — Observation Stay (HOSPITAL_COMMUNITY): Payer: Medicaid Other

## 2022-11-14 DIAGNOSIS — K8000 Calculus of gallbladder with acute cholecystitis without obstruction: Secondary | ICD-10-CM | POA: Diagnosis not present

## 2022-11-14 LAB — BASIC METABOLIC PANEL
Anion gap: 9 (ref 5–15)
BUN: 8 mg/dL (ref 6–20)
CO2: 23 mmol/L (ref 22–32)
Calcium: 7.9 mg/dL — ABNORMAL LOW (ref 8.9–10.3)
Chloride: 106 mmol/L (ref 98–111)
Creatinine, Ser: 0.71 mg/dL (ref 0.44–1.00)
GFR, Estimated: >60 mL/min (ref >60)
Glucose, Bld: 84 mg/dL (ref 70–99)
Potassium: 2.9 mmol/L — ABNORMAL LOW (ref 3.5–5.1)
Sodium: 138 mmol/L (ref 135–145)

## 2022-11-14 LAB — HIV ANTIBODY (ROUTINE TESTING W REFLEX): HIV Screen 4th Generation wRfx: NONREACTIVE

## 2022-11-14 LAB — CBC
HCT: 37.3 % (ref 36.0–46.0)
Hemoglobin: 12.6 g/dL (ref 12.0–15.0)
MCH: 31.7 pg (ref 26.0–34.0)
MCHC: 33.8 g/dL (ref 30.0–36.0)
MCV: 93.7 fL (ref 80.0–100.0)
Platelets: 164 10*3/uL (ref 150–400)
RBC: 3.98 MIL/uL (ref 3.87–5.11)
RDW: 12.5 % (ref 11.5–15.5)
WBC: 5.8 10*3/uL (ref 4.0–10.5)
nRBC: 0 % (ref 0.0–0.2)

## 2022-11-14 LAB — MAGNESIUM: Magnesium: 1.8 mg/dL (ref 1.7–2.4)

## 2022-11-14 MED ORDER — PIPERACILLIN-TAZOBACTAM 3.375 G IVPB 30 MIN
3.3750 g | Freq: Once | INTRAVENOUS | Status: AC
  Administered 2022-11-14: 3.375 g via INTRAVENOUS
  Filled 2022-11-14 (×2): qty 50

## 2022-11-14 MED ORDER — POTASSIUM CHLORIDE 10 MEQ/100ML IV SOLN
10.0000 meq | INTRAVENOUS | Status: AC
  Administered 2022-11-14 (×6): 10 meq via INTRAVENOUS
  Filled 2022-11-14 (×4): qty 100

## 2022-11-14 MED ORDER — PIPERACILLIN-TAZOBACTAM 3.375 G IVPB
3.3750 g | Freq: Three times a day (TID) | INTRAVENOUS | Status: DC
  Administered 2022-11-14: 3.375 g via INTRAVENOUS
  Filled 2022-11-14: qty 50

## 2022-11-14 MED ORDER — FAMOTIDINE IN NACL 20-0.9 MG/50ML-% IV SOLN
20.0000 mg | Freq: Once | INTRAVENOUS | Status: AC
  Administered 2022-11-15: 20 mg via INTRAVENOUS
  Filled 2022-11-14: qty 50

## 2022-11-14 MED ORDER — SODIUM CHLORIDE 0.9 % IV SOLN
2.0000 g | INTRAVENOUS | Status: DC
  Administered 2022-11-15: 2 g via INTRAVENOUS
  Filled 2022-11-14: qty 20

## 2022-11-14 MED ORDER — METRONIDAZOLE 500 MG/100ML IV SOLN
500.0000 mg | Freq: Two times a day (BID) | INTRAVENOUS | Status: DC
  Administered 2022-11-15: 500 mg via INTRAVENOUS
  Filled 2022-11-14: qty 100

## 2022-11-14 MED ORDER — TECHNETIUM TC 99M MEBROFENIN IV KIT
5.4000 | PACK | Freq: Once | INTRAVENOUS | Status: AC | PRN
  Administered 2022-11-14: 5.4 via INTRAVENOUS

## 2022-11-14 MED ORDER — DIPHENHYDRAMINE HCL 50 MG/ML IJ SOLN
50.0000 mg | Freq: Once | INTRAMUSCULAR | Status: AC
  Administered 2022-11-14: 50 mg via INTRAVENOUS
  Filled 2022-11-14: qty 1

## 2022-11-14 NOTE — Progress Notes (Addendum)
PROGRESS NOTE    Vanessa Carrillo  [id number] DOB: 10/19/1970 DOA: 11/13/2022 PCP: Elwin Mocha, MD    Brief Narrative:   Vanessa Carrillo is a 52 y.o. female with past medical history significant for constipation who presented to [place] [facility] on 3/2 with complaints of nausea/vomiting with associated abdominal pain.  Onset over the last 2 weeks, and initially started after eating a meal.  She saw her PCP who thought this was related to her constipation and was given medications to assist.  Patient reports no relief with treatment.  Pain initially located in the right upper quadrant that is reported as a sharp stabbing pain that waxes and wanes.  Also associated with nausea/vomiting.  In the ED, temperature 98.3 F, HR 74, RR 18, BP 124/90, SpO2 100% on room air.  WBC 9.9, hemoglobin 14.3, platelets 182.  Sodium 138, potassium 2.9, chloride 100, CO2 26, glucose 97, BUN 11, creatinine 0.67.  AST 12, ALT 8, total bilirubin 0.5.  Lipase less than 10.  CT abdomen/pelvis with contrast with marked circumferential gallbladder wall thickening with 13 mm calcified gallstone towards the fundus consistent with acute cholecystitis.  Punctate nonobstructing stone upper pole left kidney.  4 mm right lower lobe subpleural nodule.  Right upper quadrant ultrasound with abnormal gallbladder with wall thickening and stones consistent with acute cholecystitis.  General surgery was consulted and patient was transferred to [facility] under the hospitalist service for further workup and management of acute cholecystitis.  Assessment & Plan:   Cholelithiasis with acute cholecystitis Patient presenting with 2-week history of progressive abdominal pain localized to the right upper quadrant associate with nausea/vomiting.  Worse with oral intake.  Patient is afebrile without leukocytosis.  CT abdomen/pelvis and right upper quadrant ultrasound with findings consistent with acute cholecystitis. -- General  surgery following, appreciate assistance -- HIDA scan: Pending -- N.p.o. -- NS at 75 mL/h -- Zosyn -- Further per general surgery  Hypokalemia Potassium 2.9.  Likely secondary to GI loss in the setting of nausea/vomiting.  Will replete. -- Check magnesium level -- Repeat electrolytes in the a.m.  Pulmonary nodule Incidental finding of a 4 mm right lower lobe subpleural nodule. -- Outpatient follow-up with PCP  Family history of early CAD Patient reports both her mother and father deceased in their early 73s related to MI and heart failure.  Recently establish care with cardiology, seen by Dr. Angelena Form on 11/12/2022.  Planned outpatient coronary CT and echocardiogram.  Currently denies chest pain. -- Will check echocardiogram -- Outpatient follow-up with cardiology  Obesity Body mass index is 31.83 kg/m.  Patient needs aggressive lifestyle changes/weight loss as this complicates all facets of care.  Outpatient follow-up with PCP.  May benefit from bariatric evaluation outpatient.   DVT prophylaxis: enoxaparin (LOVENOX) injection 40 mg Start: 11/13/22 1630    Code Status: Full Code Family Communication: No family present at bedside this morning  Disposition Plan:  Level of care: Med-Surg Status is: Observation The patient remains OBS appropriate and will d/c before 2 midnights.    Consultants:  General surgery  Procedures:  HIDA scan: Pending  Antimicrobials:  Zosyn 3/3>>   Subjective: Patient seen examined bedside, resting comfortably.  Lying in bed.  RN present.  Continues with intermittent abdominal pain.  No other questions or concerns at this time.  Discussed with patient that general surgery requesting HIDA scan which is likely to be done today.  Also discussed with patient and her family history of coronary artery  disease which is being worked up by cardiology outpatient.  Discussed will also obtain echocardiogram while she is here.  No other specific questions or  concerns at this time.  Denies headache, no dizziness, no chest pain, no palpitations, no shortness of breath, no fever/chills/night sweats, no current nausea/vomiting, no diarrhea, no cough/congestion, no focal weakness, no fatigue, no paresthesias.  No acute events overnight per nursing staff.  Objective: Vitals:   11/13/22 1727 11/13/22 2031 11/14/22 0114 11/14/22 0907  BP: 122/81 113/72 101/86 104/74  Pulse: 91 74 66 74  Resp: 17   17  Temp: 98.5 F (36.9 C) 99.2 F (37.3 C) 98.5 F (36.9 C) 98.7 F (37.1 C)  TempSrc: Oral Oral Oral Oral  SpO2: 97% 98% 100% 100%  Weight:      Height:        Intake/Output Summary (Last 24 hours) at 11/14/2022 1007 Last data filed at 11/14/2022 0008 Gross per 24 hour  Intake 1602.4 ml  Output --  Net 1602.4 ml   Filed Weights   11/13/22 1016  Weight: 78.9 kg    Examination:  Physical Exam: GEN: NAD, alert and oriented x 3, obese HEENT: NCAT, PERRL, EOMI, sclera clear, MMM PULM: CTAB w/o wheezes/crackles, normal respiratory effort, on room air CV: RRR w/o M/G/R GI: abd soft, NTND, NABS, no R/G/M MSK: no peripheral edema, muscle strength globally intact 5/5 bilateral upper/lower extremities NEURO: CN II-XII intact, no focal deficits, sensation to light touch intact PSYCH: normal mood/affect Integumentary: dry/intact, no rashes or wounds    Data Reviewed: I have personally reviewed following labs and imaging studies  CBC: Recent Labs  Lab 11/13/22 0955 11/14/22 0428  WBC 9.9 5.8  NEUTROABS 5.5  --   HGB 14.3 12.6  HCT 42.2 37.3  MCV 92.7 93.7  PLT 182 [id number]   Basic Metabolic Panel: Recent Labs  Lab 11/12/22 0943 11/13/22 0955 11/14/22 0428  NA 143 138 138  K 3.3* 2.9* 2.9*  CL 100 100 106  CO2 '26 26 23  '$ GLUCOSE 109* 97 84  BUN '11 11 8  '$ CREATININE 0.72 0.67 0.71  CALCIUM 9.5 9.6 7.9*   GFR: Estimated Creatinine Clearance: 80 mL/min (by C-G formula based on SCr of 0.71 mg/dL). Liver Function Tests: Recent Labs   Lab 11/13/22 0955  AST 12*  ALT 8  ALKPHOS 62  BILITOT 0.5  PROT 8.0  ALBUMIN 3.9   Recent Labs  Lab 11/13/22 0955  LIPASE <10*   No results for input(s): "AMMONIA" in the last 168 hours. Coagulation Profile: No results for input(s): "INR", "PROTIME" in the last 168 hours. Cardiac Enzymes: No results for input(s): "CKTOTAL", "CKMB", "CKMBINDEX", "TROPONINI" in the last 168 hours. BNP (last 3 results) No results for input(s): "PROBNP" in the last 8760 hours. HbA1C: No results for input(s): "HGBA1C" in the last 72 hours. CBG: No results for input(s): "GLUCAP" in the last 168 hours. Lipid Profile: No results for input(s): "CHOL", "HDL", "LDLCALC", "TRIG", "CHOLHDL", "LDLDIRECT" in the last 72 hours. Thyroid Function Tests: No results for input(s): "TSH", "T4TOTAL", "FREET4", "T3FREE", "THYROIDAB" in the last 72 hours. Anemia Panel: No results for input(s): "VITAMINB12", "FOLATE", "FERRITIN", "TIBC", "IRON", "RETICCTPCT" in the last 72 hours. Sepsis Labs: No results for input(s): "PROCALCITON", "LATICACIDVEN" in the last 168 hours.  No results found for this or any previous visit (from the past 240 hour(s)).       Radiology Studies: US Abdomen Limited RUQ (LIVER/GB)  Result Date: 11/13/2022 CLINICAL DATA:  [id number] Pain  [id number] EXAM: ULTRASOUND ABDOMEN LIMITED COMPARISON:  None Available. FINDINGS: The liver demonstrates normal parenchymal echogenicity and homogeneous texture without focal hepatic parenchymal lesions or intrahepatic ductal dilatation. Hepatopetal portal vein. Gallbladder demonstrates numerous shadowing stones and evidence of wall thickening suggestive of cholecystitis. CBD measured 0.5cm. Imaged portions of the pancreas were unremarkable. IMPRESSION: Abnormal gallbladder with wall thickening and stones. Findings suggest acute cholecystitis. Electronically Signed   By: Sammie Bench M.D.   On: 11/13/2022 12:21   CT Abdomen Pelvis W Contrast  Result Date:  11/13/2022 CLINICAL DATA:  Left lower quadrant abdominal pain with nausea vomiting and diarrhea. EXAM: CT ABDOMEN AND PELVIS WITH CONTRAST TECHNIQUE: Multidetector CT imaging of the abdomen and pelvis was performed using the standard protocol following bolus administration of intravenous contrast. RADIATION DOSE REDUCTION: This exam was performed according to the departmental dose-optimization program which includes automated exposure control, adjustment of the mA and/or kV according to patient size and/or use of iterative reconstruction technique. CONTRAST:  98m OMNIPAQUE IOHEXOL 300 MG/ML  SOLN COMPARISON:  None Available. FINDINGS: Lower chest: 4 mm right lower lobe subpleural nodule visible on image 7/series 4. Hepatobiliary: No suspicious focal abnormality within the liver parenchyma. No intrahepatic biliary duct dilatation. Marked circumferential gallbladder wall thickening noted with 13 mm calcified gallstone towards the fundus. Narrowing of the gallbladder fundus may be secondary to adenomyomatosis. Common bile duct diameter upper normal at 6 mm. Pancreas: No focal mass lesion. No dilatation of the main duct. No intraparenchymal cyst. No peripancreatic edema. Spleen: No splenomegaly. No focal mass lesion. Adrenals/Urinary Tract: No adrenal nodule or mass. Punctate nonobstructing stone noted upper pole left kidney. Right kidney appears mildly atrophic. No hydroureteronephrosis. The urinary bladder appears normal for the degree of distention. Stomach/Bowel: Stomach is unremarkable. No gastric wall thickening. No evidence of outlet obstruction. Duodenum is normally positioned as is the ligament of Treitz. No small bowel wall thickening. No small bowel dilatation. The terminal ileum is normal. The appendix is normal. No gross colonic mass. No colonic wall thickening. Vascular/Lymphatic: There is mild atherosclerotic calcification of the abdominal aorta without aneurysm. There is no gastrohepatic or  hepatoduodenal ligament lymphadenopathy. No retroperitoneal or mesenteric lymphadenopathy. No pelvic sidewall lymphadenopathy. Reproductive: Unremarkable. Other: No intraperitoneal free fluid. Musculoskeletal: No worrisome lytic or sclerotic osseous abnormality. IMPRESSION: 1. Marked circumferential gallbladder wall thickening with 13 mm calcified gallstone towards the fundus. Imaging features are concerning for acute cholecystitis. Possible adenomyomatosis at the gallbladder fundus. 2. Punctate nonobstructing stone upper pole left kidney. 3. 4 mm right lower lobe subpleural nodule. No follow-up needed if patient is low-risk.This recommendation follows the consensus statement: Guidelines for Management of Incidental Pulmonary Nodules Detected on CT Images: From the Fleischner Society 2017; Radiology 2017; 284:228-243. 4.  Aortic Atherosclerosis (ICD10-I70.0). Electronically Signed   By: EMisty StanleyM.D.   On: 11/13/2022 11:39        Scheduled Meds:  enoxaparin (LOVENOX) injection  40 mg Subcutaneous Q24H   sodium chloride flush  3 mL Intravenous Q12H   Continuous Infusions:  sodium chloride 75 mL/hr at 11/14/22 0456   piperacillin-tazobactam (ZOSYN)  IV     potassium chloride 10 mEq (11/14/22 0927)     LOS: 0 days    Time spent: 52 minutes spent on chart review, discussion with nursing staff, consultants, updating family and interview/physical exam; more than 50% of that time was spent in counseling and/or coordination of care.    Lilybeth Vien J [place] DO [facility] Available via Epic secure chat 7am-7pm After  these hours, please refer to coverage provider listed on [email or web address] 11/14/2022, 10:07 AM

## 2022-11-14 NOTE — Progress Notes (Signed)
Pharmacy Antibiotic Note  Vanessa Carrillo is a 52 y.o. female admitted on 11/13/2022 with acute cholecystitis.  Pharmacy has been consulted for Zosyn dosing.  Plan: Zosyn 3.375g IV q8h (4 hour infusion).  Height: '5\' 2"'$  (157.5 cm) Weight: 78.9 kg (174 lb) IBW/kg (Calculated) : 50.1  Temp (24hrs), Avg:98.5 F (36.9 C), Min:98.2 F (36.8 C), Max:99.2 F (37.3 C)  Recent Labs  Lab 11/12/22 0943 11/13/22 0955 11/14/22 0428  WBC  --  9.9 5.8  CREATININE 0.72 0.67 0.71    Estimated Creatinine Clearance: 80 mL/min (by C-G formula based on SCr of 0.71 mg/dL).    No Known Allergies  Thank you for allowing pharmacy to be a part of this patient's care.  Wynona Neat, PharmD, BCPS  11/14/2022 7:34 AM

## 2022-11-14 NOTE — Progress Notes (Signed)
Assessment & Plan: HD#2 - abdominal pain, rule out cholecystitis, cholelithiasis - persistent left sided abdominal pain, no tenderness - HIDA scan to be performed today - will review - WBC normal, LFT's normal - empiric abx's - cardiology evaluation ongoing  Etiology of abdominal pain is unclear.  Imaging studies suggest cholecystitis but exam is completely benign.  Agree with HIDA - apparently radiology will perform today.  Will check results and discuss with Dr. Dema Severin.        Armandina Gemma, MD [facility] [place] [facility] Office: [phone]        Chief Complaint: Abdominal pain  Subjective: Patient in bed, describes left abdominal pain.  Passing flatus, no BM.  Objective: Vital signs in last 24 hours: Temp:  [98.2 F (36.8 C)-99.2 F (37.3 C)] 98.7 F (37.1 C) (03/03 0907) Pulse Rate:  [66-91] 74 (03/03 0907) Resp:  [17] 17 (03/03 0907) BP: (101-129)/(72-90) 104/74 (03/03 0907) SpO2:  [97 %-100 %] 100 % (03/03 0907) Weight:  [78.9 kg] 78.9 kg (03/02 1016) Last BM Date : 12/10/22  Intake/Output from previous day: 03/02 0701 - 03/03 0700 In: 1602.4 [I.V.:602.4; IV Piggyback:1000] Out: -  Intake/Output this shift: No intake/output data recorded.  Physical Exam: HEENT - sclerae clear, mucous membranes moist Neck - soft Abdomen - soft without distension; non-tender; no mass; no guarding Ext - no edema, non-tender Neuro - alert & oriented, no focal deficits  Lab Results:  Recent Labs    11/13/22 0955 11/14/22 0428  WBC 9.9 5.8  HGB 14.3 12.6  HCT 42.2 37.3  PLT 182 164   BMET Recent Labs    11/13/22 0955 11/14/22 0428  NA 138 138  K 2.9* 2.9*  CL 100 106  CO2 26 23  GLUCOSE 97 84  BUN 11 8  CREATININE 0.67 0.71  CALCIUM 9.6 7.9*   PT/INR No results for input(s): "LABPROT", "INR" in the last 72 hours. Comprehensive Metabolic Panel:    Component Value Date/Time   NA 138 11/14/2022 0428   NA 138 11/13/2022 0955   NA 143  11/12/2022 0943   NA 142 04/21/2022 1042   K 2.9 (L) 11/14/2022 0428   K 2.9 (L) 11/13/2022 0955   CL 106 11/14/2022 0428   CL 100 11/13/2022 0955   CO2 23 11/14/2022 0428   CO2 26 11/13/2022 0955   BUN 8 11/14/2022 0428   BUN 11 11/13/2022 0955   BUN 11 11/12/2022 0943   BUN 11 04/21/2022 1042   CREATININE 0.71 11/14/2022 0428   CREATININE 0.67 11/13/2022 0955   GLUCOSE 84 11/14/2022 0428   GLUCOSE 97 11/13/2022 0955   CALCIUM 7.9 (L) 11/14/2022 0428   CALCIUM 9.6 11/13/2022 0955   AST 12 (L) 11/13/2022 0955   AST 13 04/21/2022 1042   ALT 8 11/13/2022 0955   ALT 9 04/21/2022 1042   ALKPHOS 62 11/13/2022 0955   ALKPHOS 100 04/21/2022 1042   BILITOT 0.5 11/13/2022 0955   BILITOT <0.2 04/21/2022 1042   PROT 8.0 11/13/2022 0955   PROT 6.9 04/21/2022 1042   ALBUMIN 3.9 11/13/2022 0955   ALBUMIN 4.1 04/21/2022 1042    Studies/Results: US Abdomen Limited RUQ (LIVER/GB)  Result Date: 11/13/2022 CLINICAL DATA:  [id number] Pain [id number] EXAM: ULTRASOUND ABDOMEN LIMITED COMPARISON:  None Available. FINDINGS: The liver demonstrates normal parenchymal echogenicity and homogeneous texture without focal hepatic parenchymal lesions or intrahepatic ductal dilatation. Hepatopetal portal vein. Gallbladder demonstrates numerous shadowing stones and evidence of wall thickening suggestive of  cholecystitis. CBD measured 0.5cm. Imaged portions of the pancreas were unremarkable. IMPRESSION: Abnormal gallbladder with wall thickening and stones. Findings suggest acute cholecystitis. Electronically Signed   By: Sammie Bench M.D.   On: 11/13/2022 12:21   CT Abdomen Pelvis W Contrast  Result Date: 11/13/2022 CLINICAL DATA:  Left lower quadrant abdominal pain with nausea vomiting and diarrhea. EXAM: CT ABDOMEN AND PELVIS WITH CONTRAST TECHNIQUE: Multidetector CT imaging of the abdomen and pelvis was performed using the standard protocol following bolus administration of intravenous contrast. RADIATION DOSE  REDUCTION: This exam was performed according to the departmental dose-optimization program which includes automated exposure control, adjustment of the mA and/or kV according to patient size and/or use of iterative reconstruction technique. CONTRAST:  26m OMNIPAQUE IOHEXOL 300 MG/ML  SOLN COMPARISON:  None Available. FINDINGS: Lower chest: 4 mm right lower lobe subpleural nodule visible on image 7/series 4. Hepatobiliary: No suspicious focal abnormality within the liver parenchyma. No intrahepatic biliary duct dilatation. Marked circumferential gallbladder wall thickening noted with 13 mm calcified gallstone towards the fundus. Narrowing of the gallbladder fundus may be secondary to adenomyomatosis. Common bile duct diameter upper normal at 6 mm. Pancreas: No focal mass lesion. No dilatation of the main duct. No intraparenchymal cyst. No peripancreatic edema. Spleen: No splenomegaly. No focal mass lesion. Adrenals/Urinary Tract: No adrenal nodule or mass. Punctate nonobstructing stone noted upper pole left kidney. Right kidney appears mildly atrophic. No hydroureteronephrosis. The urinary bladder appears normal for the degree of distention. Stomach/Bowel: Stomach is unremarkable. No gastric wall thickening. No evidence of outlet obstruction. Duodenum is normally positioned as is the ligament of Treitz. No small bowel wall thickening. No small bowel dilatation. The terminal ileum is normal. The appendix is normal. No gross colonic mass. No colonic wall thickening. Vascular/Lymphatic: There is mild atherosclerotic calcification of the abdominal aorta without aneurysm. There is no gastrohepatic or hepatoduodenal ligament lymphadenopathy. No retroperitoneal or mesenteric lymphadenopathy. No pelvic sidewall lymphadenopathy. Reproductive: Unremarkable. Other: No intraperitoneal free fluid. Musculoskeletal: No worrisome lytic or sclerotic osseous abnormality. IMPRESSION: 1. Marked circumferential gallbladder wall  thickening with 13 mm calcified gallstone towards the fundus. Imaging features are concerning for acute cholecystitis. Possible adenomyomatosis at the gallbladder fundus. 2. Punctate nonobstructing stone upper pole left kidney. 3. 4 mm right lower lobe subpleural nodule. No follow-up needed if patient is low-risk.This recommendation follows the consensus statement: Guidelines for Management of Incidental Pulmonary Nodules Detected on CT Images: From the Fleischner Society 2017; Radiology 2017; 284:228-243. 4.  Aortic Atherosclerosis (ICD10-I70.0). Electronically Signed   By: EMisty StanleyM.D.   On: 11/13/2022 11:39      TArmandina Gemma3/11/2022  Patient ID: TWillaim Bane female   DOB: [birth year] 52y.o.   MRN: [id number]

## 2022-11-15 ENCOUNTER — Observation Stay (HOSPITAL_COMMUNITY): Payer: Medicaid Other

## 2022-11-15 DIAGNOSIS — K219 Gastro-esophageal reflux disease without esophagitis: Secondary | ICD-10-CM

## 2022-11-15 DIAGNOSIS — Z791 Long term (current) use of non-steroidal anti-inflammatories (NSAID): Secondary | ICD-10-CM

## 2022-11-15 DIAGNOSIS — K8020 Calculus of gallbladder without cholecystitis without obstruction: Secondary | ICD-10-CM

## 2022-11-15 DIAGNOSIS — K8000 Calculus of gallbladder with acute cholecystitis without obstruction: Secondary | ICD-10-CM | POA: Diagnosis not present

## 2022-11-15 LAB — COMPREHENSIVE METABOLIC PANEL
ALT: 9 U/L (ref 0–44)
AST: 14 U/L — ABNORMAL LOW (ref 15–41)
Albumin: 2.4 g/dL — ABNORMAL LOW (ref 3.5–5.0)
Alkaline Phosphatase: 52 U/L (ref 38–126)
Anion gap: 5 (ref 5–15)
BUN: 12 mg/dL (ref 6–20)
CO2: 24 mmol/L (ref 22–32)
Calcium: 8.2 mg/dL — ABNORMAL LOW (ref 8.9–10.3)
Chloride: 108 mmol/L (ref 98–111)
Creatinine, Ser: 0.63 mg/dL (ref 0.44–1.00)
GFR, Estimated: >60 mL/min (ref >60)
Glucose, Bld: 85 mg/dL (ref 70–99)
Potassium: 3.5 mmol/L (ref 3.5–5.1)
Sodium: 137 mmol/L (ref 135–145)
Total Bilirubin: 0.6 mg/dL (ref 0.3–1.2)
Total Protein: 5.7 g/dL — ABNORMAL LOW (ref 6.5–8.1)

## 2022-11-15 LAB — CBC
HCT: 37.4 % (ref 36.0–46.0)
Hemoglobin: 12.4 g/dL (ref 12.0–15.0)
MCH: 31.6 pg (ref 26.0–34.0)
MCHC: 33.2 g/dL (ref 30.0–36.0)
MCV: 95.2 fL (ref 80.0–100.0)
Platelets: 177 10*3/uL (ref 150–400)
RBC: 3.93 MIL/uL (ref 3.87–5.11)
RDW: 12.5 % (ref 11.5–15.5)
WBC: 5.3 10*3/uL (ref 4.0–10.5)
nRBC: 0 % (ref 0.0–0.2)

## 2022-11-15 LAB — MAGNESIUM: Magnesium: 1.8 mg/dL (ref 1.7–2.4)

## 2022-11-15 MED ORDER — ESOMEPRAZOLE MAGNESIUM 40 MG PO CPDR: 40.0000 mg | DELAYED_RELEASE_CAPSULE | Freq: Every day | ORAL | 2 refills | Status: DC

## 2022-11-15 MED ORDER — PANTOPRAZOLE SODIUM 40 MG PO TBEC: 40.0000 mg | DELAYED_RELEASE_TABLET | Freq: Every day | ORAL | Status: DC

## 2022-11-15 NOTE — Progress Notes (Signed)
Discharge instructions given. Patient verbalized understanding and all questions were answered.  

## 2022-11-15 NOTE — Progress Notes (Signed)
Subjective: CC: No abdominal pain presently. Had some indigestion after first sip of soda last night but thinks that is because she hadn't eaten for some time. Was able to tolerate ceaser salad without abdominal pain, n/v otherwise last night. Had episode of sharp RUQ abdominal pain this am that lasted for 15 minutes and self resolved. Not associated with po intake. Has not recurred. Reports all her prior pain was left sided prior to this. No prn pain meds since yesterday. No lower abdominal pain. Reports recent constipation leading to admission but is passing flatus and had a bm this am. Has never had a colonoscopy.   Objective: Vital signs in last 24 hours: Temp:  [97.9 F (36.6 C)-99 F (37.2 C)] 97.9 F (36.6 C) (03/04 0429) Pulse Rate:  [65-82] 65 (03/04 0429) Resp:  [16-18] 16 (03/04 0429) BP: (107-129)/(69-97) 107/69 (03/04 0429) SpO2:  [99 %-100 %] 100 % (03/04 0429) Last BM Date : 12/10/22  Intake/Output from previous day: 03/03 0701 - 03/04 0700 In: 2642.7 [I.V.:1790.5; IV Piggyback:852.2] Out: -  Intake/Output this shift: No intake/output data recorded.  PE: Gen:  Alert, NAD, pleasant Abd: Soft, ND, NT, +BS  Lab Results:  Recent Labs    11/14/22 0428 11/15/22 0253  WBC 5.8 5.3  HGB 12.6 12.4  HCT 37.3 37.4  PLT 164 177   BMET Recent Labs    11/14/22 0428 11/15/22 0253  NA 138 137  K 2.9* 3.5  CL 106 108  CO2 23 24  GLUCOSE 84 85  BUN 8 12  CREATININE 0.71 0.63  CALCIUM 7.9* 8.2*   PT/INR No results for input(s): "LABPROT", "INR" in the last 72 hours. CMP     Component Value Date/Time   NA 137 11/15/2022 0253   NA 143 11/12/2022 0943   K 3.5 11/15/2022 0253   CL 108 11/15/2022 0253   CO2 24 11/15/2022 0253   GLUCOSE 85 11/15/2022 0253   BUN 12 11/15/2022 0253   BUN 11 11/12/2022 0943   CREATININE 0.63 11/15/2022 0253   CALCIUM 8.2 (L) 11/15/2022 0253   PROT 5.7 (L) 11/15/2022 0253   PROT 6.9 04/21/2022 1042   ALBUMIN 2.4 (L)  11/15/2022 0253   ALBUMIN 4.1 04/21/2022 1042   AST 14 (L) 11/15/2022 0253   ALT 9 11/15/2022 0253   ALKPHOS 52 11/15/2022 0253   BILITOT 0.6 11/15/2022 0253   BILITOT <0.2 04/21/2022 1042   GFRNONAA >60 11/15/2022 0253   Lipase     Component Value Date/Time   LIPASE <10 (L) 11/13/2022 0955    Studies/Results: NM Hepatobiliary Liver Func  Result Date: 11/14/2022 CLINICAL DATA:  Cholecystitis. EXAM: NUCLEAR MEDICINE HEPATOBILIARY IMAGING TECHNIQUE: Sequential images of the abdomen were obtained out to 60 minutes following intravenous administration of radiopharmaceutical. RADIOPHARMACEUTICALS:  5.4 mCi Tc-61m Choletec IV COMPARISON:  Ultrasound 11/13/2022 FINDINGS: Prompt uptake and biliary excretion of activity by the liver is seen. Gallbladder activity is visualized, consistent with patency of cystic duct. Biliary activity passes into small bowel, consistent with patent common bile duct. IMPRESSION: Normal hepatic biliary scan.  No signs of acute cholecystitis. Electronically Signed   By: TKerby MoorsM.D.   On: 11/14/2022 13:13   UKoreaAbdomen Limited RUQ (LIVER/GB)  Result Date: 11/13/2022 CLINICAL DATA:  [id number] EXAM: ULTRASOUND ABDOMEN LIMITED COMPARISON:  None Available. FINDINGS: The liver demonstrates normal parenchymal echogenicity and homogeneous texture without focal hepatic parenchymal lesions or intrahepatic ductal dilatation. Hepatopetal portal vein. Gallbladder  demonstrates numerous shadowing stones and evidence of wall thickening suggestive of cholecystitis. CBD measured 0.5cm. Imaged portions of the pancreas were unremarkable. IMPRESSION: Abnormal gallbladder with wall thickening and stones. Findings suggest acute cholecystitis. Electronically Signed   By: Sammie Bench M.D.   On: 11/13/2022 12:21   CT Abdomen Pelvis W Contrast  Result Date: 11/13/2022 CLINICAL DATA:  Left lower quadrant abdominal pain with nausea vomiting and diarrhea. EXAM: CT ABDOMEN AND  PELVIS WITH CONTRAST TECHNIQUE: Multidetector CT imaging of the abdomen and pelvis was performed using the standard protocol following bolus administration of intravenous contrast. RADIATION DOSE REDUCTION: This exam was performed according to the departmental dose-optimization program which includes automated exposure control, adjustment of the mA and/or kV according to patient size and/or use of iterative reconstruction technique. CONTRAST:  31m OMNIPAQUE IOHEXOL 300 MG/ML  SOLN COMPARISON:  None Available. FINDINGS: Lower chest: 4 mm right lower lobe subpleural nodule visible on image 7/series 4. Hepatobiliary: No suspicious focal abnormality within the liver parenchyma. No intrahepatic biliary duct dilatation. Marked circumferential gallbladder wall thickening noted with 13 mm calcified gallstone towards the fundus. Narrowing of the gallbladder fundus may be secondary to adenomyomatosis. Common bile duct diameter upper normal at 6 mm. Pancreas: No focal mass lesion. No dilatation of the main duct. No intraparenchymal cyst. No peripancreatic edema. Spleen: No splenomegaly. No focal mass lesion. Adrenals/Urinary Tract: No adrenal nodule or mass. Punctate nonobstructing stone noted upper pole left kidney. Right kidney appears mildly atrophic. No hydroureteronephrosis. The urinary bladder appears normal for the degree of distention. Stomach/Bowel: Stomach is unremarkable. No gastric wall thickening. No evidence of outlet obstruction. Duodenum is normally positioned as is the ligament of Treitz. No small bowel wall thickening. No small bowel dilatation. The terminal ileum is normal. The appendix is normal. No gross colonic mass. No colonic wall thickening. Vascular/Lymphatic: There is mild atherosclerotic calcification of the abdominal aorta without aneurysm. There is no gastrohepatic or hepatoduodenal ligament lymphadenopathy. No retroperitoneal or mesenteric lymphadenopathy. No pelvic sidewall lymphadenopathy.  Reproductive: Unremarkable. Other: No intraperitoneal free fluid. Musculoskeletal: No worrisome lytic or sclerotic osseous abnormality. IMPRESSION: 1. Marked circumferential gallbladder wall thickening with 13 mm calcified gallstone towards the fundus. Imaging features are concerning for acute cholecystitis. Possible adenomyomatosis at the gallbladder fundus. 2. Punctate nonobstructing stone upper pole left kidney. 3. 4 mm right lower lobe subpleural nodule. No follow-up needed if patient is low-risk.This recommendation follows the consensus statement: Guidelines for Management of Incidental Pulmonary Nodules Detected on CT Images: From the Fleischner Society 2017; Radiology 2017; 284:228-243. 4.  Aortic Atherosclerosis (ICD10-I70.0). Electronically Signed   By: EMisty StanleyM.D.   On: 11/13/2022 11:39    Anti-infectives: Anti-infectives (From admission, onward)    Start     Dose/Rate Route Frequency Ordered Stop   11/15/22 0000  cefTRIAXone (ROCEPHIN) 2 g in sodium chloride 0.9 % 100 mL IVPB  Status:  Discontinued        2 g 200 mL/hr over 30 Minutes Intravenous Every 24 hours 11/14/22 2310 11/15/22 0804   11/15/22 0000  metroNIDAZOLE (FLAGYL) IVPB 500 mg  Status:  Discontinued        500 mg 100 mL/hr over 60 Minutes Intravenous Every 12 hours 11/14/22 2310 11/15/22 0804   11/14/22 1400  piperacillin-tazobactam (ZOSYN) IVPB 3.375 g  Status:  Discontinued        3.375 g 12.5 mL/hr over 240 Minutes Intravenous Every 8 hours 11/14/22 0734 11/14/22 2310   11/14/22 0830  piperacillin-tazobactam (ZOSYN) IVPB 3.375 g  3.375 g 100 mL/hr over 30 Minutes Intravenous  Once 11/14/22 0734 11/14/22 0837        Assessment/Plan Upper abdominal pain Cholelithiasis  - HIDA negative for Cholecystitis. WBC and LFT's non-elevated. Does not need abx from our standpoint - If patient tolerating breakfast without abdominal pain, n/v - okay for d/c from our standpoint with f/u in the office to discuss  elective Cholecystectomy. Recommended low fat diet at d/c. Return precautions discussed.  - Could benefit from GI referral as outpatient as well (reports frequent NSAID use, intermittent BRB per rectum, constipation, no prior colonoscopy etc). Consider PPI and bowel regimen at d/c.  - Discussed above with [facility]  FEN - HH VTE - SCDs, okay for chem ppx from our standpoint ID - None currently.    I reviewed nursing notes, hospitalist notes, last 24 h vitals and pain scores, last 48 h intake and output, last 24 h labs and trends, and last 24 h imaging results.   LOS: 0 days    Jillyn Ledger , [facility] 11/15/2022, 9:09 AM Please see Amion for pager number during day hours 7:00am-4:30pm

## 2022-11-15 NOTE — Progress Notes (Signed)
PT. Stated she is having an allergic reaction. Pt appeared to have a red rash on her arms, legs and neck. Medication stopped. Paged hospitalist to inform them of the reaction.  Benadryl and pepcid ordered. Pt. Stated she immediately felt relief. Antibiotic changed (see Mar)

## 2022-11-15 NOTE — Discharge Summary (Signed)
Physician Discharge Summary  AUDYN HAACK [id number] DOB: 1970-10-12 DOA: 11/13/2022  PCP: Elwin Mocha, MD  Admit date: 11/13/2022 Discharge date: 11/15/2022  Admitted From: Home Disposition: Home  Recommendations for Outpatient Follow-up:  Follow up with PCP in 1-2 weeks Follow-up with general surgery Ambulatory referral placed to GI for evaluation of reflux, concern for gastritis versus peptic ulcer with a history of NSAID abuse with recommendations of outpatient EGD Started on Nexium 40 mg p.o. daily. Continue to encourage decreased use of NSAIDs Will need continued surveillance of pulmonary nodule that was incidentally found on [place]: No Equipment/Devices: None  Discharge Condition: Stable CODE STATUS: Full code Diet recommendation: Heart healthy diet  History of present illness:  Vanessa Carrillo is a 52 y.o. female with past medical history significant for constipation who presented to [place] on 3/2 with complaints of nausea/vomiting with associated abdominal pain.  Onset over the last 2 weeks, and initially started after eating a meal.  She saw her PCP who thought this was related to her constipation and was given medications to assist.  Patient reports no relief with treatment.  Pain initially located in the right upper quadrant that is reported as a sharp stabbing pain that waxes and wanes.  Also associated with nausea/vomiting.   In the ED, temperature 98.3 F, HR 74, RR 18, BP 124/90, SpO2 100% on room air.  WBC 9.9, hemoglobin 14.3, platelets 182.  Sodium 138, potassium 2.9, chloride 100, CO2 26, glucose 97, BUN 11, creatinine 0.67.  AST 12, ALT 8, total bilirubin 0.5.  Lipase less than 10.  CT abdomen/pelvis with contrast with marked circumferential gallbladder wall thickening with 13 mm calcified gallstone towards the fundus consistent with acute cholecystitis.  Punctate nonobstructing stone upper pole left kidney.  4 mm right lower lobe  subpleural nodule.  Right upper quadrant ultrasound with abnormal gallbladder with wall thickening and stones consistent with acute cholecystitis.  General surgery was consulted and patient was transferred to [facility] under the hospitalist service for further workup and management of acute cholecystitis.  Hospital course:  Cholelithiasis  Acute cholecystitis ruled out Patient presenting with 2-week history of progressive abdominal pain localized to the right upper quadrant associate with nausea/vomiting.  Worse with oral intake.  Patient is afebrile without leukocytosis.  CT abdomen/pelvis and right upper quadrant ultrasound with findings consistent with acute cholecystitis.  General surgery was consulted and followed during hospital course.  Patient underwent HIDA scan which was normal ruling out acute cholecystitis.  Patient's liver enzymes/bilirubin were within normal limits and patient remained afebrile without leukocytosis.  Patient was initially started on Zosyn but unfortunately had allergic reaction and was transition to ceftriaxone and metronidazole during her hospitalization.  Given no findings of acute cholecystitis, antibiotics has been discontinued now.  Discharging home, outpatient follow-up with general surgery.  GERD Patient with history of NSAID abuse.  Discussed with patient needs decrease use versus abstinence.  Starting on Nexium 40 mg p.o. daily.  Outpatient referral placed to GI for consideration of EGD outpatient.  Hypokalemia Etiology likely secondary to GI loss.  Repleted during hospitalization.   Pulmonary nodule Incidental finding of a 4 mm right lower lobe subpleural nodule. -- Outpatient follow-up with PCP   Family history of early CAD Patient reports both her mother and father deceased in their early 8s related to MI and heart failure.  Recently establish care with cardiology, seen by Dr. Angelena Form on 11/12/2022.  Planned outpatient coronary CT and  echocardiogram. Denies chest pain.  Obesity Body mass index is 31.83 kg/m.  Patient needs aggressive lifestyle changes/weight loss as this complicates all facets of care.  Outpatient follow-up with PCP.  May benefit from bariatric evaluation outpatient.  Discharge Diagnoses:  Principal Problem:   Cholelithiasis with acute cholecystitis Active Problems:   Hypokalemia   Left lower quadrant abdominal pain   Obesity (BMI 30-39.9)    Discharge Instructions  Discharge Instructions     Ambulatory referral to Gastroenterology   Complete by: As directed    Hx NSAID abuse with reflux symptoms. Eval for EGD.   What is the reason for referral?: Other   Call MD for:  difficulty breathing, headache or visual disturbances   Complete by: As directed    Call MD for:  extreme fatigue   Complete by: As directed    Call MD for:  persistant dizziness or light-headedness   Complete by: As directed    Call MD for:  persistant nausea and vomiting   Complete by: As directed    Call MD for:  severe uncontrolled pain   Complete by: As directed    Call MD for:  temperature >100.4   Complete by: As directed    Diet - low sodium heart healthy   Complete by: As directed    Increase activity slowly   Complete by: As directed       Allergies as of 11/15/2022       Reactions   Zosyn [piperacillin Sod-tazobactam So]    Rash        Medication List     TAKE these medications    bisacodyl 5 MG EC tablet Commonly known as: Dulcolax Take 2 tablets (10 mg total) by mouth daily as needed for moderate constipation.   esomeprazole 40 MG capsule Commonly known as: NEXIUM Take 1 capsule (40 mg total) by mouth daily at 12 noon.   multivitamin tablet Take 1 tablet by mouth daily.        Follow-up Information     [facility] [place] Follow up.   Specialty: General Surgery Why: Please call to confirm your appointment date/time, Bring a copy of your photo ID & insurance card, Arrive 30  minutes prior to your appt for paperwork Contact information: [address] [place] [address] [phone]         Elwin Mocha, MD. Schedule an appointment as soon as possible for a visit in 1 week(s).   Specialty: Family Medicine Contact information: [place] [address] [phone]         [facility]. Schedule an appointment as soon as possible for a visit.   Specialty: Gastroenterology Contact information: [place] [phone]               Allergies  Allergen Reactions   Zosyn [Piperacillin Sod-Tazobactam So]     Rash    Consultations: General surgery   Procedures/Studies: NM Hepatobiliary Liver Func  Result Date: 11/14/2022 CLINICAL DATA:  Cholecystitis. EXAM: NUCLEAR MEDICINE HEPATOBILIARY IMAGING TECHNIQUE: Sequential images of the abdomen were obtained out to 60 minutes following intravenous administration of radiopharmaceutical. RADIOPHARMACEUTICALS:  5.4 mCi Tc-30m Choletec IV COMPARISON:  Ultrasound 11/13/2022 FINDINGS: Prompt uptake and biliary excretion of activity by the liver is seen. Gallbladder activity is visualized, consistent with patency of cystic duct. Biliary activity passes into small bowel, consistent with patent common bile duct. IMPRESSION: Normal hepatic biliary scan.  No signs of acute cholecystitis. Electronically Signed   By: Kerby Moors M.D.   On: 11/14/2022 13:13   US Abdomen Limited RUQ (LIVER/GB)  Result Date: 11/13/2022 CLINICAL DATA:  [id number] Pain [id number] EXAM: ULTRASOUND ABDOMEN LIMITED COMPARISON:  None Available. FINDINGS: The liver demonstrates normal parenchymal echogenicity and homogeneous texture without focal hepatic parenchymal lesions or intrahepatic ductal dilatation. Hepatopetal portal vein. Gallbladder demonstrates numerous shadowing stones and evidence of wall thickening suggestive of cholecystitis. CBD  measured 0.5cm. Imaged portions of the pancreas were unremarkable. IMPRESSION: Abnormal gallbladder with wall thickening and stones. Findings suggest acute cholecystitis. Electronically Signed   By: Sammie Bench M.D.   On: 11/13/2022 12:21   CT Abdomen Pelvis W Contrast  Result Date: 11/13/2022 CLINICAL DATA:  Left lower quadrant abdominal pain with nausea vomiting and diarrhea. EXAM: CT ABDOMEN AND PELVIS WITH CONTRAST TECHNIQUE: Multidetector CT imaging of the abdomen and pelvis was performed using the standard protocol following bolus administration of intravenous contrast. RADIATION DOSE REDUCTION: This exam was performed according to the departmental dose-optimization program which includes automated exposure control, adjustment of the mA and/or kV according to patient size and/or use of iterative reconstruction technique. CONTRAST:  34m OMNIPAQUE IOHEXOL 300 MG/ML  SOLN COMPARISON:  None Available. FINDINGS: Lower chest: 4 mm right lower lobe subpleural nodule visible on image 7/series 4. Hepatobiliary: No suspicious focal abnormality within the liver parenchyma. No intrahepatic biliary duct dilatation. Marked circumferential gallbladder wall thickening noted with 13 mm calcified gallstone towards the fundus. Narrowing of the gallbladder fundus may be secondary to adenomyomatosis. Common bile duct diameter upper normal at 6 mm. Pancreas: No focal mass lesion. No dilatation of the main duct. No intraparenchymal cyst. No peripancreatic edema. Spleen: No splenomegaly. No focal mass lesion. Adrenals/Urinary Tract: No adrenal nodule or mass. Punctate nonobstructing stone noted upper pole left kidney. Right kidney appears mildly atrophic. No hydroureteronephrosis. The urinary bladder appears normal for the degree of distention. Stomach/Bowel: Stomach is unremarkable. No gastric wall thickening. No evidence of outlet obstruction. Duodenum is normally positioned as is the ligament of Treitz. No small bowel  wall thickening. No small bowel dilatation. The terminal ileum is normal. The appendix is normal. No gross colonic mass. No colonic wall thickening. Vascular/Lymphatic: There is mild atherosclerotic calcification of the abdominal aorta without aneurysm. There is no gastrohepatic or hepatoduodenal ligament lymphadenopathy. No retroperitoneal or mesenteric lymphadenopathy. No pelvic sidewall lymphadenopathy. Reproductive: Unremarkable. Other: No intraperitoneal free fluid. Musculoskeletal: No worrisome lytic or sclerotic osseous abnormality. IMPRESSION: 1. Marked circumferential gallbladder wall thickening with 13 mm calcified gallstone towards the fundus. Imaging features are concerning for acute cholecystitis. Possible adenomyomatosis at the gallbladder fundus. 2. Punctate nonobstructing stone upper pole left kidney. 3. 4 mm right lower lobe subpleural nodule. No follow-up needed if patient is low-risk.This recommendation follows the consensus statement: Guidelines for Management of Incidental Pulmonary Nodules Detected on CT Images: From the Fleischner Society 2017; Radiology 2017; 284:228-243. 4.  Aortic Atherosclerosis (ICD10-I70.0). Electronically Signed   By: EMisty StanleyM.D.   On: 11/13/2022 11:39   DG Abd 1 View  Result Date: 11/06/2022 CLINICAL DATA:  Pain EXAM: ABDOMEN - 1 VIEW COMPARISON:  None Available. FINDINGS: The bowel gas pattern is normal. Pelvic calcification might be a fibroid. This can be assessed further with CT or ultrasound. Otherwise no evidence of radiopaque calculi. IMPRESSION: Unremarkable bowel gas pattern. Pelvic calcification, likely a fibroid. Electronically Signed   By: JSammie BenchM.D.   On: 11/06/2022 00:41  Subjective: Patient seen examined bedside, resting calmly.  Lying in bed.  Family present.  Seen by general surgery this morning, signing off no indication for surgical intervention at this time; recommend outpatient follow-up.  HIDA scan was negative for  acute cholecystitis yesterday.  Discussed with patient high suspicion of her NSAID use leading to reflux possible gastritis and possible underlying ulcer.  Will refer to outpatient GI for further evaluation.  No other questions or concerns at this time.  Denies headache, no dizziness, no chest pain, no palpitations, no current abdominal pain, no shortness of breath, no fever/chills/night sweats, no nausea/vomiting/diarrhea, no focal weakness, no fatigue, no cough/congestion, no paresthesias.  No acute events overnight per nursing staff.  Discharge Exam: Vitals:   11/15/22 0429 11/15/22 0856  BP: 107/69 128/79  Pulse: 65 71  Resp: 16 17  Temp: 97.9 F (36.6 C) 98.4 F (36.9 C)  SpO2: 100% 99%   Vitals:   11/14/22 1646 11/14/22 2034 11/15/22 0429 11/15/22 0856  BP: (!) 116/97 129/85 107/69 128/79  Pulse: 78 82 65 71  Resp: '18 18 16 17  '$ Temp: 98.4 F (36.9 C) 99 F (37.2 C) 97.9 F (36.6 C) 98.4 F (36.9 C)  TempSrc: Oral Oral Oral Oral  SpO2: 99% 99% 100% 99%  Weight:      Height:        Physical Exam: GEN: NAD, alert and oriented x 3, obese HEENT: NCAT, PERRL, EOMI, sclera clear, MMM PULM: CTAB w/o wheezes/crackles, normal respiratory effort CV: RRR w/o M/G/R GI: abd soft, NTND, NABS, no R/G/M MSK: no peripheral edema, muscle strength globally intact 5/5 bilateral upper/lower extremities NEURO: CN II-XII intact, no focal deficits, sensation to light touch intact PSYCH: normal mood/affect Integumentary: dry/intact, no rashes or wounds    The results of significant diagnostics from this hospitalization (including imaging, microbiology, ancillary and laboratory) are listed below for reference.     Microbiology: No results found for this or any previous visit (from the past 240 hour(s)).   Labs: BNP (last 3 results) No results for input(s): "BNP" in the last 8760 hours. Basic Metabolic Panel: Recent Labs  Lab 11/12/22 0943 11/13/22 0955 11/14/22 0428 11/15/22 0253   NA 143 138 138 137  K 3.3* 2.9* 2.9* 3.5  CL 100 100 106 108  CO2 '26 26 23 24  '$ GLUCOSE 109* 97 84 85  BUN '11 11 8 12  '$ CREATININE 0.72 0.67 0.71 0.63  CALCIUM 9.5 9.6 7.9* 8.2*  MG  --   --  1.8 1.8   Liver Function Tests: Recent Labs  Lab 11/13/22 0955 11/15/22 0253  AST 12* 14*  ALT 8 9  ALKPHOS 62 52  BILITOT 0.5 0.6  PROT 8.0 5.7*  ALBUMIN 3.9 2.4*   Recent Labs  Lab 11/13/22 0955  LIPASE <10*   No results for input(s): "AMMONIA" in the last 168 hours. CBC: Recent Labs  Lab 11/13/22 0955 11/14/22 0428 11/15/22 0253  WBC 9.9 5.8 5.3  NEUTROABS 5.5  --   --   HGB 14.3 12.6 12.4  HCT 42.2 37.3 37.4  MCV 92.7 93.7 95.2  PLT 182 164 177   Cardiac Enzymes: No results for input(s): "CKTOTAL", "CKMB", "CKMBINDEX", "TROPONINI" in the last 168 hours. BNP: Invalid input(s): "POCBNP" CBG: No results for input(s): "GLUCAP" in the last 168 hours. D-Dimer No results for input(s): "DDIMER" in the last 72 hours. Hgb A1c No results for input(s): "HGBA1C" in the last 72 hours. Lipid Profile No results for input(s): "  CHOL", "HDL", "LDLCALC", "TRIG", "CHOLHDL", "LDLDIRECT" in the last 72 hours. Thyroid function studies No results for input(s): "TSH", "T4TOTAL", "T3FREE", "THYROIDAB" in the last 72 hours.  Invalid input(s): "FREET3" Anemia work up No results for input(s): "VITAMINB12", "FOLATE", "FERRITIN", "TIBC", "IRON", "RETICCTPCT" in the last 72 hours. Urinalysis    Component Value Date/Time   COLORURINE YELLOW 01/31/2018 [place] 01/31/2018 1655   LABSPEC 1.024 01/31/2018 1655   PHURINE 5.0 01/31/2018 1655   GLUCOSEU NEGATIVE 01/31/2018 1655   HGBUR SMALL (A) 01/31/2018 1655   BILIRUBINUR Negative 04/21/2022 1043   KETONESUR 5 (A) 01/31/2018 1655   PROTEINUR Negative 04/21/2022 1043   PROTEINUR NEGATIVE 01/31/2018 1655   UROBILINOGEN 0.2 04/21/2022 1043   UROBILINOGEN 0.2 04/06/2014 0922   NITRITE Neagtive 04/21/2022 1043   NITRITE  NEGATIVE 01/31/2018 1655   LEUKOCYTESUR Negative 04/21/2022 1043   Sepsis Labs Recent Labs  Lab 11/13/22 0955 11/14/22 0428 11/15/22 0253  WBC 9.9 5.8 5.3   Microbiology No results found for this or any previous visit (from the past 240 hour(s)).   Time coordinating discharge: Over 30 minutes  SIGNED:   Mishelle Hassan J [place] DO  [facility] 11/15/2022, 9:22 AM

## 2022-11-16 ENCOUNTER — Encounter: Payer: Self-pay | Admitting: Gastroenterology

## 2022-11-24 ENCOUNTER — Telehealth (HOSPITAL_COMMUNITY): Payer: Self-pay | Admitting: *Deleted

## 2022-11-24 NOTE — Telephone Encounter (Signed)
Reaching out to patient to offer assistance regarding upcoming cardiac imaging study; pt verbalizes understanding of appt date/time, parking situation and where to check in, pre-test NPO status and verified current allergies; name and call back number provided for further questions should they arise  Tavi Hoogendoorn RN Navigator Cardiac Imaging [facility] [phone] office [phone] cell  Patient aware to arrive at 9am. 

## 2022-11-25 ENCOUNTER — Ambulatory Visit: Payer: Medicaid Other | Admitting: Family Medicine

## 2022-11-25 ENCOUNTER — Ambulatory Visit (HOSPITAL_COMMUNITY)
Admission: RE | Admit: 2022-11-25 | Discharge: 2022-11-25 | Disposition: A | Discharge status: Home / Self Care | Payer: Commercial Managed Care - HMO | Source: Ambulatory Visit | Attending: Cardiovascular Disease | Admitting: Cardiovascular Disease

## 2022-11-25 ENCOUNTER — Encounter: Payer: Self-pay | Admitting: Family Medicine

## 2022-11-25 VITALS — BP 131/92 | HR 74 | Temp 97.6°F | Ht 62.0 in | Wt 182.0 lb

## 2022-11-25 DIAGNOSIS — Z09 Encounter for follow-up examination after completed treatment for conditions other than malignant neoplasm: Secondary | ICD-10-CM

## 2022-11-25 DIAGNOSIS — R072 Precordial pain: Secondary | ICD-10-CM

## 2022-11-25 DIAGNOSIS — Z91041 Radiographic dye allergy status: Secondary | ICD-10-CM

## 2022-11-25 MED ORDER — NITROGLYCERIN 0.4 MG SL SUBL
0.8000 mg | SUBLINGUAL_TABLET | Freq: Once | SUBLINGUAL | Status: AC
  Administered 2022-11-25: 0.8 mg via SUBLINGUAL

## 2022-11-25 MED ORDER — IOHEXOL 350 MG/ML SOLN
100.0000 mL | Freq: Once | INTRAVENOUS | Status: AC | PRN
  Administered 2022-11-25: 100 mL via INTRAVENOUS

## 2022-11-25 MED ORDER — NITROGLYCERIN 0.4 MG SL SUBL
SUBLINGUAL_TABLET | SUBLINGUAL | Status: AC
  Filled 2022-11-25: qty 2

## 2022-11-26 MED ORDER — HYDROXYZINE HCL 10 MG PO TABS: 10.0000 mg | ORAL_TABLET | Freq: Three times a day (TID) | ORAL | 0 refills | Status: DC | PRN

## 2022-11-26 MED ORDER — CETIRIZINE HCL 10 MG PO TABS: 10.0000 mg | ORAL_TABLET | Freq: Every day | ORAL | 0 refills | Status: DC

## 2022-11-26 NOTE — Progress Notes (Unsigned)
   Established Patient Office Visit  Subjective   Patient ID: Vanessa Carrillo, female    DOB: 09-06-1971  Age: 52 y.o. MRN: [id number]  Chief Complaint  Patient presents with   Follow-up    F/U from ER for Gallstones.     HPI  {History (Optional):23778}  ROS    Objective:     BP (!) 131/92   Pulse 74   Temp 97.6 F (36.4 C)   Ht 5\' 2"  (1.575 m)   Wt 182 lb (82.6 kg)   SpO2 97%   BMI 33.29 kg/m  {Vitals History (Optional):23777}  Physical Exam   No results found for any visits on 11/25/22.  {Labs (Optional):23779}  The 10-year ASCVD risk score (Arnett DK, et al., 2019) is: 1.9%    Assessment & Plan:   Problem List Items Addressed This Visit   None Visit Diagnoses     Contrast media allergy    -  Primary   Relevant Medications   hydrOXYzine (ATARAX) 10 MG tablet       No follow-ups on file.    Elwin Mocha, MD

## 2022-11-30 ENCOUNTER — Telehealth: Payer: Self-pay | Admitting: *Deleted

## 2022-11-30 DIAGNOSIS — R918 Other nonspecific abnormal finding of lung field: Secondary | ICD-10-CM

## 2022-11-30 NOTE — Telephone Encounter (Addendum)
-----   Message from [name], MD sent at 11/26/2022  7:51 AM EDT ----- Calcium score zero. No evidence of CAD. Cdm  [name], MD 11/29/2022  8:45 AM EDT Back to Top    No evidence of CAD, calcium score zero. There are irregular areas in her lungs/nodules. She needs to be referred to see Pulmonary. Can we place that referral and let her know? Thanks, chris  _____________________________________________________________________________   Vanessa Carrillo patient and reviewed results and recommendation.  Referral placed to pulmonary.  Will send copy to PCP.  The patient also has a GI work up at the end of April.  She reported that all 3 times that she recently had CT scans she was given medication that caused her to swell and develop a rash right after.  At the ER she was given benadryl and so that is what she did the next time.  Her PCP office also adv her to use benadryl and pepcid.  Will add IV contrast to allergy list.

## 2022-12-09 ENCOUNTER — Encounter: Payer: Self-pay | Admitting: Emergency Medicine

## 2022-12-09 ENCOUNTER — Ambulatory Visit (INDEPENDENT_AMBULATORY_CARE_PROVIDER_SITE_OTHER): Payer: Commercial Managed Care - HMO | Admitting: Emergency Medicine

## 2022-12-09 VITALS — BP 118/70 | HR 61 | Temp 98.2°F | Ht 62.0 in | Wt 182.0 lb

## 2022-12-09 DIAGNOSIS — R918 Other nonspecific abnormal finding of lung field: Secondary | ICD-10-CM

## 2022-12-09 NOTE — Addendum Note (Signed)
Addended by: Dierdre Highman on: 12/09/2022 04:29 PM   Modules accepted: Orders

## 2022-12-09 NOTE — Addendum Note (Signed)
Addended by: Suzzanne Cloud E on: 12/09/2022 03:38 PM   Modules accepted: Orders

## 2022-12-09 NOTE — Progress Notes (Signed)
Subjective:    Patient ID: Vanessa Carrillo, female    DOB: 1970/10/10, 52 y.o.   MRN: [id number]  HPI 52 year old woman, has never smoked tobacco, did have THC exposure, with a history of chronic back pain and osteoarthritis.  She was admitted for cholecystitis and gastritis/peptic ulcer disease in early March.  During that hospitalization a CT of the abdomen and pelvis showed a right lower lobe 4 mm subpleural nodule.  Subsequently she had a coronary morphology CT chest 11/25/2022.  This study identified multiple pulmonary nodules as below.  She is here to discuss those scans and plan evaluation/follow-up.  Coronary CT 11/25/2022 reviewed by me showed multiple pulmonary nodules: 15 mm in the right middle lobe abutting the minor fissure, 7 mm in the right lower lobe anteriorly, 4 mm subpleural right lower lobe, 5 mm left lower lobe.  No infiltrates or effusions.  There was some mild central bronchial thickening   Review of Systems As per HPI  Past Medical History:  Diagnosis Date   Back pain    Chest pain    Constipation    Knee pain    Leg pain      Family History  Problem Relation Age of Onset   Heart disease Mother        MI   Diabetes Mother    CVA Father    Diabetes Father    Diabetes Maternal Grandmother    Congenital heart disease Other     No family hx lung CA   Social History   Socioeconomic History   Marital status: Single    Spouse name: Not on file   Number of children: 2   Years of education: Not on file   Highest education level: Not on file  Occupational History   Occupation: [employer]  Tobacco Use   Smoking status: Former   Smokeless tobacco: Never   Tobacco comments:    Pt smoke marijuana for many years [facility] 12/09/22  Vaping Use   Vaping Use: Never used  Substance and Sexual Activity   Alcohol use: Yes    Comment: occasional   Drug use: Yes    Frequency: 2.0 times per week    Types: Marijuana   Sexual activity: Yes    Birth  control/protection: Surgical  Other Topics Concern   Not on file  Social History Narrative   Not on file   Social Determinants of Health   Financial Resource Strain: Not on file  Food Insecurity: Not on file  Transportation Needs: Not on file  Physical Activity: Not on file  Stress: Not on file  Social Connections: Not on file  Intimate Partner Violence: Not on file    Has been in medical field, 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 Has lived in [place], [place], [place], [place] ? Mold exposure at home No hot tub or pool     Allergies  Allergen Reactions   Ivp Dye [Iodinated Contrast Media] Rash    Pt reports hot raised and flat rash and swelling in legs, no throat swelling./mw   Zosyn [Piperacillin Sod-Tazobactam So]     Rash     Outpatient Medications Prior to Visit  Medication Sig Dispense Refill   bisacodyl (DULCOLAX) 5 MG EC tablet Take 2 tablets (10 mg total) by mouth daily as needed for moderate constipation. 30 tablet 0   cetirizine (ZYRTEC) 10 MG tablet Take 1 tablet (10 mg total) by mouth daily for 3 days. 3 tablet 0  esomeprazole (NEXIUM) 40 MG capsule Take 1 capsule (40 mg total) by mouth daily at 12 noon. (Patient not taking: Reported on 12/09/2022) 30 capsule 2   hydrOXYzine (ATARAX) 10 MG tablet Take 1 tablet (10 mg total) by mouth 3 (three) times daily as needed. (Patient not taking: Reported on 12/09/2022) 30 tablet 0   Multiple Vitamin (MULTIVITAMIN) tablet Take 1 tablet by mouth daily. (Patient not taking: Reported on 12/09/2022)     No facility-administered medications prior to visit.        Objective:   Physical Exam  Vitals:   12/09/22 1456  BP: 118/70  Pulse: 61  Temp: 98.2 F (36.8 C)  TempSrc: Oral  SpO2: 99%  Weight: 182 lb (82.6 kg)  Height: 5\' 2"  (1.575 m)   Gen: Pleasant, well-nourished, in no distress,  normal affect  ENT: No lesions,  mouth clear,  oropharynx clear, no postnasal drip  Neck: No JVD, no stridor  Lungs: No use of accessory muscles,  no crackles or wheezing on normal respiration, no wheeze on forced expiration  Cardiovascular: RRR, heart sounds normal, no murmur or gallops, no peripheral edema  Musculoskeletal: No deformities, no cyanosis or clubbing  Neuro: alert, awake, non focal  Skin: Warm, no lesions or rash      Latest Ref Rng & Units 11/15/2022    2:53 AM 11/14/2022    4:28 AM 11/13/2022    9:55 AM  BMP  Glucose 70 - 99 mg/dL 85  84  97   BUN 6 - 20 mg/dL 12  8  11    Creatinine 0.44 - 1.00 mg/dL 0.63  0.71  0.67   Sodium 135 - 145 mmol/L 137  138  138   Potassium 3.5 - 5.1 mmol/L 3.5  2.9  2.9   Chloride 98 - 111 mmol/L 108  106  100   CO2 22 - 32 mmol/L 24  23  26    Calcium 8.9 - 10.3 mg/dL 8.2  7.9  9.6        Latest Ref Rng & Units 11/15/2022    2:53 AM 11/14/2022    4:28 AM 11/13/2022    9:55 AM  CBC  WBC 4.0 - 10.5 K/uL 5.3  5.8  9.9   Hemoglobin 12.0 - 15.0 g/dL 12.4  12.6  14.3   Hematocrit 36.0 - 46.0 % 37.4  37.3  42.2   Platelets 150 - 400 K/uL 177  164  182          Assessment & Plan:  Pulmonary nodules Multiple pulmonary nodules and a low risk patient.  Etiology unclear.  She is asymptomatic.  She has had pneumonia in the past but no significant exposures except for some THC.  No known history of autoimmune disease.  We will check some autoimmune labs, ACE level, etc. today.  We will plan to repeat imaging in June, obtain super D CT chest and PET scan to better characterize.  Depending on those results we will decide whether bronchoscopy is indicated for culture data, cytology.   Baltazar Apo, MD, PhD 12/09/2022, 3:29 PM [facility] [phone] or if no answer before 7:00PM call [phone] For any issues after 7:00PM please call eLink [phone]

## 2022-12-09 NOTE — Assessment & Plan Note (Signed)
Multiple pulmonary nodules and a low risk patient.  Etiology unclear.  She is asymptomatic.  She has had pneumonia in the past but no significant exposures except for some THC.  No known history of autoimmune disease.  We will check some autoimmune labs, ACE level, etc. today.  We will plan to repeat imaging in June, obtain super D CT chest and PET scan to better characterize.  Depending on those results we will decide whether bronchoscopy is indicated for culture data, cytology.

## 2022-12-09 NOTE — Addendum Note (Signed)
Addended by: Dierdre Highman on: 12/09/2022 03:36 PM   Modules accepted: Orders

## 2022-12-09 NOTE — Patient Instructions (Signed)
We reviewed your CT scan of the chest today. We will plan to perform a CT chest/PET scan in 3 months (June/2024) We will perform lab work today Follow Dr. Lamonte Sakai in June after your scans are done so we can review the results together.

## 2022-12-10 LAB — RHEUMATOID FACTOR: Rheumatoid fact SerPl-aCnc: <14 IU/mL (ref <14)

## 2022-12-11 LAB — ANCA SCREEN W REFLEX TITER: ANCA SCREEN: NEGATIVE

## 2022-12-11 LAB — CBC WITH DIFFERENTIAL/PLATELET
Absolute Monocytes: 449 cells/uL (ref 200–950)
Basophils Absolute: 48 cells/uL (ref 0–200)
Basophils Relative: 0.7 %
Eosinophils Absolute: 331 cells/uL (ref 15–500)
Eosinophils Relative: 4.8 %
HCT: 43.1 % (ref 35.0–45.0)
Hemoglobin: 14.4 g/dL (ref 11.7–15.5)
Lymphs Abs: 3671 cells/uL (ref 850–3900)
MCH: 31.2 pg (ref 27.0–33.0)
MCHC: 33.4 g/dL (ref 32.0–36.0)
MCV: 93.5 fL (ref 80.0–100.0)
MPV: 11 fL (ref 7.5–12.5)
Monocytes Relative: 6.5 %
Neutro Abs: 2401 cells/uL (ref 1500–7800)
Neutrophils Relative %: 34.8 %
Platelets: 205 10*3/uL (ref 140–400)
RBC: 4.61 10*6/uL (ref 3.80–5.10)
RDW: 13.3 % (ref 11.0–15.0)
Total Lymphocyte: 53.2 %
WBC: 6.9 10*3/uL (ref 3.8–10.8)

## 2022-12-11 LAB — ANTI-NUCLEAR AB-TITER (ANA TITER): ANA Titer 1: 1:40 {titer} — ABNORMAL HIGH

## 2022-12-11 LAB — QUANTIFERON-TB GOLD PLUS
Mitogen-NIL: >10 IU/mL
NIL: 0.06 IU/mL
QuantiFERON-TB Gold Plus: NEGATIVE
TB1-NIL: <0 IU/mL
TB2-NIL: <0 IU/mL

## 2022-12-11 LAB — ANGIOTENSIN CONVERTING ENZYME: Angiotensin-Converting Enzyme: 43 U/L (ref 9–67)

## 2022-12-11 LAB — ANA: Anti Nuclear Antibody (ANA): POSITIVE — AB

## 2022-12-13 NOTE — Telephone Encounter (Signed)
Patient stated she had an Echocardiogram done when she was in the hospital on 3/2 and wants to know if she still needs to have another Echocardiogram done on 4/4.

## 2022-12-14 NOTE — Telephone Encounter (Signed)
Called pt and advised that she had [place] of liver and GB but no echocardiogram.  She will keep echo appointment for Thursday.

## 2022-12-16 ENCOUNTER — Ambulatory Visit (HOSPITAL_COMMUNITY): Payer: Medicaid Other | Attending: Cardiovascular Disease

## 2022-12-16 DIAGNOSIS — R072 Precordial pain: Secondary | ICD-10-CM | POA: Insufficient documentation

## 2022-12-16 DIAGNOSIS — R079 Chest pain, unspecified: Secondary | ICD-10-CM | POA: Diagnosis present

## 2022-12-16 LAB — ECHOCARDIOGRAM COMPLETE
Area-P 1/2: 4.06 cm2
Calc EF: 58.3 %
S' Lateral: 3.1 cm
Single Plane A2C EF: 60.1 %
Single Plane A4C EF: 59.6 %

## 2022-12-22 ENCOUNTER — Telehealth: Payer: Self-pay | Admitting: Emergency Medicine

## 2022-12-22 NOTE — Telephone Encounter (Signed)
Dr. Delton Coombes can you please advise on patients lab work?

## 2022-12-22 NOTE — Telephone Encounter (Signed)
Please let her know that all the inflammatory markers on her lab work were negative/normal.  The only exception was a slightly elevated antinuclear antibody (ANA).  At this low level it is unclear whether that result is clinically meaningful (it can often be elevated for no important reason).  We can discuss further when we follow-up.

## 2022-12-22 NOTE — Telephone Encounter (Signed)
Patient would like labwork results. Patient phone number is [phone].

## 2022-12-22 NOTE — Telephone Encounter (Signed)
Spoke to pt and gave her, her lab results interpreted by Dr Delton Coombes. Pt verbalized understanding nothing further needed.

## 2022-12-23 ENCOUNTER — Ambulatory Visit: Payer: Commercial Managed Care - HMO | Admitting: Family Medicine

## 2023-01-10 ENCOUNTER — Ambulatory Visit (INDEPENDENT_AMBULATORY_CARE_PROVIDER_SITE_OTHER): Payer: Medicaid Other | Admitting: Gastroenterology

## 2023-01-10 ENCOUNTER — Encounter: Payer: Self-pay | Admitting: Gastroenterology

## 2023-01-10 VITALS — BP 122/62 | HR 62 | Ht 62.0 in | Wt 179.0 lb

## 2023-01-10 DIAGNOSIS — Z1211 Encounter for screening for malignant neoplasm of colon: Secondary | ICD-10-CM

## 2023-01-10 DIAGNOSIS — R1013 Epigastric pain: Secondary | ICD-10-CM | POA: Diagnosis not present

## 2023-01-10 DIAGNOSIS — K219 Gastro-esophageal reflux disease without esophagitis: Secondary | ICD-10-CM

## 2023-01-10 MED ORDER — PANTOPRAZOLE SODIUM 40 MG PO TBEC: 40.0000 mg | DELAYED_RELEASE_TABLET | Freq: Every day | ORAL | 3 refills | Status: AC

## 2023-01-10 NOTE — Patient Instructions (Signed)
_______________________________________________________  If your blood pressure at your visit was 140/90 or greater, please contact your primary care physician to follow up on this.  _______________________________________________________  If you are age 52 or older, your body mass index should be between 23-30. Your Body mass index is 32.74 kg/m. If this is out of the aforementioned range listed, please consider follow up with your Primary Care Provider.  If you are age 60 or younger, your body mass index should be between 19-25. Your Body mass index is 32.74 kg/m. If this is out of the aformentioned range listed, please consider follow up with your Primary Care Provider.   We have sent the following medications to your pharmacy for you to pick up at your convenience: Pantoprazole 40 mg daily. ________________________________________________________  The [facility] providers would like to encourage you to use [facility] to communicate with providers for non-urgent requests or questions.  Due to long hold times on the telephone, sending your provider a message by [facility] may be a faster and more efficient way to get a response.  Please allow 48 business hours for a response.  Please remember that this is for non-urgent requests.   It was a pleasure to see you today!  Thank you for trusting me with your gastrointestinal care!    Scott E.Cunningham,MD

## 2023-01-10 NOTE — Progress Notes (Signed)
HPI : Vanessa Carrillo is a very pleasant 52 year old female with no significant chronic medical history who is referred to [place] by Dr. Westley Hummer for further evaluation and management of chronic GERD symptoms.  Patient states that she has had problems with GERD for many years.  She describes her symptoms as a burning pain in her chest and throat ("like there is a fire inside") ,acid regurgitation/sour taste, nausea and excessive belching.  She has the symptoms most days of the week.  She notices that the symptoms are worse when she eats late in the evening.  Her symptoms more likely bother her after dinner or at bedtime.  She has awakened from sleep many times with the symptoms. She has noticed her symptoms are worsened with fatty or greasy foods. She denies any dysphagia.  No problems with cough or hoarseness, but she does report excessive phlegm. No weight loss, rather her weight has increased over the past year going from a baseline of about 150 to around 180 pounds now. She has taken Tums as needed for the symptoms which helped a little.  She was recently prescribed Nexium, but was not able to fill the prescription.  She was admitted from March 2 through March 4 with upper abdominal pain.  CT and ultrasound findings were suggestive of cholecystitis.  However, the patient had normal liver enzymes, no fevers and normal white blood cell count.  A HIDA scan was unremarkable.  General surgery followed the patient and felt that her abdominal pain was not secondary to cholecystitis cholecystectomy.  Her abdominal pain resolved and she has not had any recurrence of the pain.  She denies having problems with this pain prior to her admission. Patient thinks that her pain was because of constipation.  She had developed constipation for several days.  This also resolved and has not been a problem since that hospitalization.  She has a bowel movement daily with formed brown stools.  No problems with straining or  hard stools.  No blood in the stool. She had been taking ibuprofen for a few days when she was having this abdominal pain, but she has not taken any almost 2 months.  She denies frequent NSAID use.  She has never undergone an upper or lower endoscopy.  No family history of GI malignancy. She has a Cologuard kit at home, but has not yet submitted it.   Past Medical History:  Diagnosis Date   Back pain    Chest pain    Constipation    Knee pain    Leg pain      Past Surgical History:  Procedure Laterality Date   CESAREAN SECTION     TONSILLECTOMY     Family History  Problem Relation Age of Onset   Heart disease Mother        MI   Diabetes Mother    CVA Father    Diabetes Father    Diabetes Maternal Grandmother    Congenital heart disease Other    Social History   Tobacco Use   Smoking status: Former   Smokeless tobacco: Never   Tobacco comments:    Pt smoke marijuana for many years ARJ 12/09/22  Vaping Use   Vaping Use: Never used  Substance Use Topics   Alcohol use: Yes    Comment: occasional   Drug use: Yes    Frequency: 2.0 times per week    Types: Marijuana   Current Outpatient Medications  Medication Sig Dispense Refill  bisacodyl (DULCOLAX) 5 MG EC tablet Take 2 tablets (10 mg total) by mouth daily as needed for moderate constipation. 30 tablet 0   cetirizine (ZYRTEC) 10 MG tablet Take 1 tablet (10 mg total) by mouth daily for 3 days. 3 tablet 0   esomeprazole (NEXIUM) 40 MG capsule Take 1 capsule (40 mg total) by mouth daily at 12 noon. (Patient not taking: Reported on 12/09/2022) 30 capsule 2   hydrOXYzine (ATARAX) 10 MG tablet Take 1 tablet (10 mg total) by mouth 3 (three) times daily as needed. (Patient not taking: Reported on 12/09/2022) 30 tablet 0   Multiple Vitamin (MULTIVITAMIN) tablet Take 1 tablet by mouth daily. (Patient not taking: Reported on 12/09/2022)     No current facility-administered medications for this visit.   Allergies  Allergen  Reactions   Ivp Dye [Iodinated Contrast Media] Rash    Pt reports hot raised and flat rash and swelling in legs, no throat swelling./mw   Zosyn [Piperacillin Sod-Tazobactam So]     Rash     Review of Systems: All systems reviewed and negative except where noted in HPI.    ECHOCARDIOGRAM COMPLETE  Result Date: 12/16/2022    ECHOCARDIOGRAM REPORT   Patient Name:   Vanessa Carrillo Date of Exam: 12/16/2022 Medical Rec #:  [id number]        Height:       62.0 in Accession #:    [id number]       Weight:       182.0 lb Date of Birth:  1971/09/11        BSA:          1.837 m Patient Age:    52 years         BP:           118/70 mmHg Patient Gender: F                HR:           50 bpm. Exam Location:  [address] Procedure: 2D Echo, Cardiac Doppler, Color Doppler and Strain Analysis Indications:    R07.9* Chest pain, unspecified  History:        Patient has no prior history of Echocardiogram examinations.  Sonographer:    Eulah Pont RDCS Referring Phys: [phone] CHRISTOPHER D [facility]  Sonographer Comments: Global longitudinal strain was attempted. IMPRESSIONS  1. Left ventricular ejection fraction, by estimation, is 55 to 60%. The left ventricle has normal function. The left ventricle has no regional wall motion abnormalities. Left ventricular diastolic parameters were normal.  2. Right ventricular systolic function is normal. The right ventricular size is normal. There is normal pulmonary artery systolic pressure.  3. The mitral valve is normal in structure. No evidence of mitral valve regurgitation. No evidence of mitral stenosis.  4. The aortic valve is normal in structure. Aortic valve regurgitation is not visualized. No aortic stenosis is present.  5. The inferior vena cava is normal in size with greater than 50% respiratory variability, suggesting right atrial pressure of 3 mmHg. FINDINGS  Left Ventricle: Left ventricular ejection fraction, by estimation, is 55 to 60%. The left ventricle has normal  function. The left ventricle has no regional wall motion abnormalities. The left ventricular internal cavity size was normal in size. There is  no left ventricular hypertrophy. Left ventricular diastolic parameters were normal. Right Ventricle: The right ventricular size is normal. No increase in right ventricular wall thickness. Right ventricular systolic function is normal. There is  normal pulmonary artery systolic pressure. The tricuspid regurgitant velocity is 1.51 m/s, and  with an assumed right atrial pressure of 8 mmHg, the estimated right ventricular systolic pressure is 17.1 mmHg. Left Atrium: Left atrial size was normal in size. Right Atrium: Right atrial size was normal in size. Pericardium: There is no evidence of pericardial effusion. Mitral Valve: The mitral valve is normal in structure. No evidence of mitral valve regurgitation. No evidence of mitral valve stenosis. Tricuspid Valve: The tricuspid valve is normal in structure. Tricuspid valve regurgitation is trivial. No evidence of tricuspid stenosis. Aortic Valve: The aortic valve is normal in structure. Aortic valve regurgitation is not visualized. No aortic stenosis is present. Pulmonic Valve: The pulmonic valve was normal in structure. Pulmonic valve regurgitation is not visualized. No evidence of pulmonic stenosis. Aorta: The aortic root is normal in size and structure. Venous: The inferior vena cava is normal in size with greater than 50% respiratory variability, suggesting right atrial pressure of 3 mmHg. IAS/Shunts: No atrial level shunt detected by color flow Doppler.  LEFT VENTRICLE PLAX 2D LVIDd:         4.80 cm     Diastology LVIDs:         3.10 cm     LV e' medial:    8.84 cm/s LV PW:         0.80 cm     LV E/e' medial:  11.2 LV IVS:        0.70 cm     LV e' lateral:   12.20 cm/s LVOT diam:     2.00 cm     LV E/e' lateral: 8.1 LV SV:         56 LV SV Index:   30 LVOT Area:     3.14 cm  LV Volumes (MOD) LV vol d, MOD A2C: 82.5 ml LV vol  d, MOD A4C: 87.7 ml LV vol s, MOD A2C: 32.9 ml LV vol s, MOD A4C: 35.4 ml LV SV MOD A2C:     49.6 ml LV SV MOD A4C:     87.7 ml LV SV MOD BP:      51.9 ml RIGHT VENTRICLE RV S prime:     11.90 cm/s TAPSE (M-mode): 2.1 cm LEFT ATRIUM             Index        RIGHT ATRIUM           Index LA diam:        3.20 cm 1.74 cm/m   RA Area:     10.00 cm LA Vol (A2C):   29.9 ml 16.28 ml/m  RA Volume:   19.90 ml  10.84 ml/m LA Vol (A4C):   32.4 ml 17.64 ml/m LA Biplane Vol: 34.5 ml 18.78 ml/m  AORTIC VALVE LVOT Vmax:   81.60 cm/s LVOT Vmean:  51.100 cm/s LVOT VTI:    0.177 m  AORTA Ao Root diam: 2.90 cm Ao Asc diam:  3.30 cm MITRAL VALVE               TRICUSPID VALVE MV Area (PHT): 4.06 cm    TR Peak grad:   9.1 mmHg MV Decel Time: 187 msec    TR Vmax:        151.00 cm/s MV E velocity: 99.00 cm/s MV A velocity: 76.70 cm/s  SHUNTS MV E/A ratio:  1.29        Systemic VTI:  0.18 m  Systemic Diam: 2.00 cm Arvilla Meres MD Electronically signed by Arvilla Meres MD Signature Date/Time: 12/16/2022/1:23:52 PM    Final     Physical Exam: BP 122/62   Pulse 62   Ht 5\' 2"  (1.575 m)   Wt 179 lb (81.2 kg)   BMI 32.74 kg/m  Constitutional: Pleasant,well-developed, African American female in no acute distress. HEENT: Normocephalic and atraumatic. Conjunctivae are normal. No scleral icterus. Neck supple.  Cardiovascular: Normal rate, regular rhythm.  Pulmonary/chest: Effort normal and breath sounds normal. No wheezing, rales or rhonchi. Abdominal: Soft, nondistended, nontender.  Negative Murphy's.  Bowel sounds active throughout. There are no masses palpable. No hepatomegaly. Extremities: no edema Neurological: Alert and oriented to person place and time. Skin: Skin is warm and dry. No rashes noted. Psychiatric: Normal mood and affect. Behavior is normal.  CBC    Component Value Date/Time   WBC 6.9 12/09/2022 1545   RBC 4.61 12/09/2022 1545   HGB 14.4 12/09/2022 1545   HGB 15.0  04/21/2022 1042   HCT 43.1 12/09/2022 1545   HCT 44.3 04/21/2022 1042   PLT 205 12/09/2022 1545   PLT 182 04/21/2022 1042   MCV 93.5 12/09/2022 1545   MCV 95 04/21/2022 1042   MCH 31.2 12/09/2022 1545   MCHC 33.4 12/09/2022 1545   RDW 13.3 12/09/2022 1545   RDW 12.9 04/21/2022 1042   LYMPHSABS 3,671 12/09/2022 1545   LYMPHSABS 2.7 04/21/2022 1042   MONOABS 1.0 11/13/2022 0955   EOSABS 331 12/09/2022 1545   EOSABS 0.1 04/21/2022 1042   BASOSABS 48 12/09/2022 1545   BASOSABS 0.0 04/21/2022 1042    CMP     Component Value Date/Time   NA 137 11/15/2022 0253   NA 143 11/12/2022 0943   K 3.5 11/15/2022 0253   CL 108 11/15/2022 0253   CO2 24 11/15/2022 0253   GLUCOSE 85 11/15/2022 0253   BUN 12 11/15/2022 0253   BUN 11 11/12/2022 0943   CREATININE 0.63 11/15/2022 0253   CALCIUM 8.2 (L) 11/15/2022 0253   PROT 5.7 (L) 11/15/2022 0253   PROT 6.9 04/21/2022 1042   ALBUMIN 2.4 (L) 11/15/2022 0253   ALBUMIN 4.1 04/21/2022 1042   AST 14 (L) 11/15/2022 0253   ALT 9 11/15/2022 0253   ALKPHOS 52 11/15/2022 0253   BILITOT 0.6 11/15/2022 0253   BILITOT <0.2 04/21/2022 1042   GFRNONAA >60 11/15/2022 0253     ASSESSMENT AND PLAN: 52 year old female with chronic typical GERD symptoms, occurring most days of the week, with nighttime predominance, currently taking OTC antacids. We discussed the pathophysiology of GERD and the principles of GERD management to include lifestyle modifications  such as dietary discretion (avoidance of alcohol, tobacco, caffeinated and carbonated beverages, spicy/greasy foods, citrus, peppermint/chocolate), weight loss if applicable, head of bed elevation andconsuming last meal of day within 3 hours of bedtime; pharmacologic options to include PPIs, H2RAs and OTC antacids; and finally surgical or endoscopic fundoplication. Given the frequency of her symptoms, I recommended she start taking a daily PPI.  Will start Protonix 40 mg p.o. daily.  She should continue  to work on the behavioral modifications to include diet modification and weight loss, and I recommended she try to wean off the Protonix in 1 to 2 months. Given her typical symptoms and the absence of the trial of PPI, there is no need for an upper endoscopy. With regards to her upper abdominal pain, given that she has not had any pain for almost 2 months, the yield  of an upper endoscopy to evaluate this pain is a very low.  I agree with the patient that her pain may have been related to constipation. We discussed colon cancer screening and the pros and cons of stool based testing versus colonoscopy.  The patient would like to proceed with her Cologuard instead of a colonoscopy.  She understands that if the Cologuard is positive, she will need a colonoscopy   GERD - Protonix 40 mg PO daily - No need for upper endoscopy  CRC screening - Cologuard  Upper abdominal pain - Resolved, no further evaluation recommended  Vinessa Macconnell E. Tomasa Rand, MD [facility]   CC:  Haydee Salter, MD

## 2023-02-04 ENCOUNTER — Telehealth: Payer: Self-pay | Admitting: Emergency Medicine

## 2023-02-04 NOTE — Telephone Encounter (Signed)
Called and spoke with pt letting her know that contrast media is not used when a PET is performed and she verbalized understanding. Nothing further needed.

## 2023-02-22 ENCOUNTER — Ambulatory Visit (HOSPITAL_COMMUNITY)
Admission: RE | Admit: 2023-02-22 | Discharge: 2023-02-22 | Disposition: A | Discharge status: Home / Self Care | Payer: Medicaid Other | Source: Ambulatory Visit | Attending: Emergency Medicine | Admitting: Emergency Medicine

## 2023-02-22 DIAGNOSIS — R918 Other nonspecific abnormal finding of lung field: Secondary | ICD-10-CM | POA: Insufficient documentation

## 2023-02-22 LAB — GLUCOSE, CAPILLARY: Glucose-Capillary: 84 mg/dL (ref 70–99)

## 2023-02-22 MED ORDER — FLUDEOXYGLUCOSE F - 18 (FDG) INJECTION
9.0000 | Freq: Once | INTRAVENOUS | Status: AC | PRN
  Administered 2023-02-22: 8.97 via INTRAVENOUS

## 2023-02-25 ENCOUNTER — Telehealth: Payer: Self-pay | Admitting: Emergency Medicine

## 2023-02-25 ENCOUNTER — Encounter (HOSPITAL_COMMUNITY): Payer: Medicaid Other

## 2023-02-25 ENCOUNTER — Ambulatory Visit: Payer: Medicaid Other | Admitting: Emergency Medicine

## 2023-02-25 DIAGNOSIS — R918 Other nonspecific abnormal finding of lung field: Secondary | ICD-10-CM

## 2023-02-25 NOTE — Telephone Encounter (Signed)
Patient came in late for her appointment on 6/14- rescheduled to first available that fit her schedule has to be 4pm, 8/5 with Buelah Manis, NP. Patient had a scan on 02/22/2023 and would like a call with the results instead of waiting until 8/5. Please advise. Patient is only available by phone on the weekdays at 830am, 1130am and she is off at 4pm.

## 2023-03-02 NOTE — Telephone Encounter (Signed)
Dr. Delton Coombes can you please advise on PET scan results?

## 2023-03-03 IMAGING — DX DG KNEE COMPLETE 4+V*R*
4 series · 4 of 4 positions shown · non-contrast
Comparison: None.

CLINICAL DATA: Right knee and leg pain, medial and posterior knee
pain

EXAM:
RIGHT KNEE - COMPLETE 4+ VIEW

[knee ap]
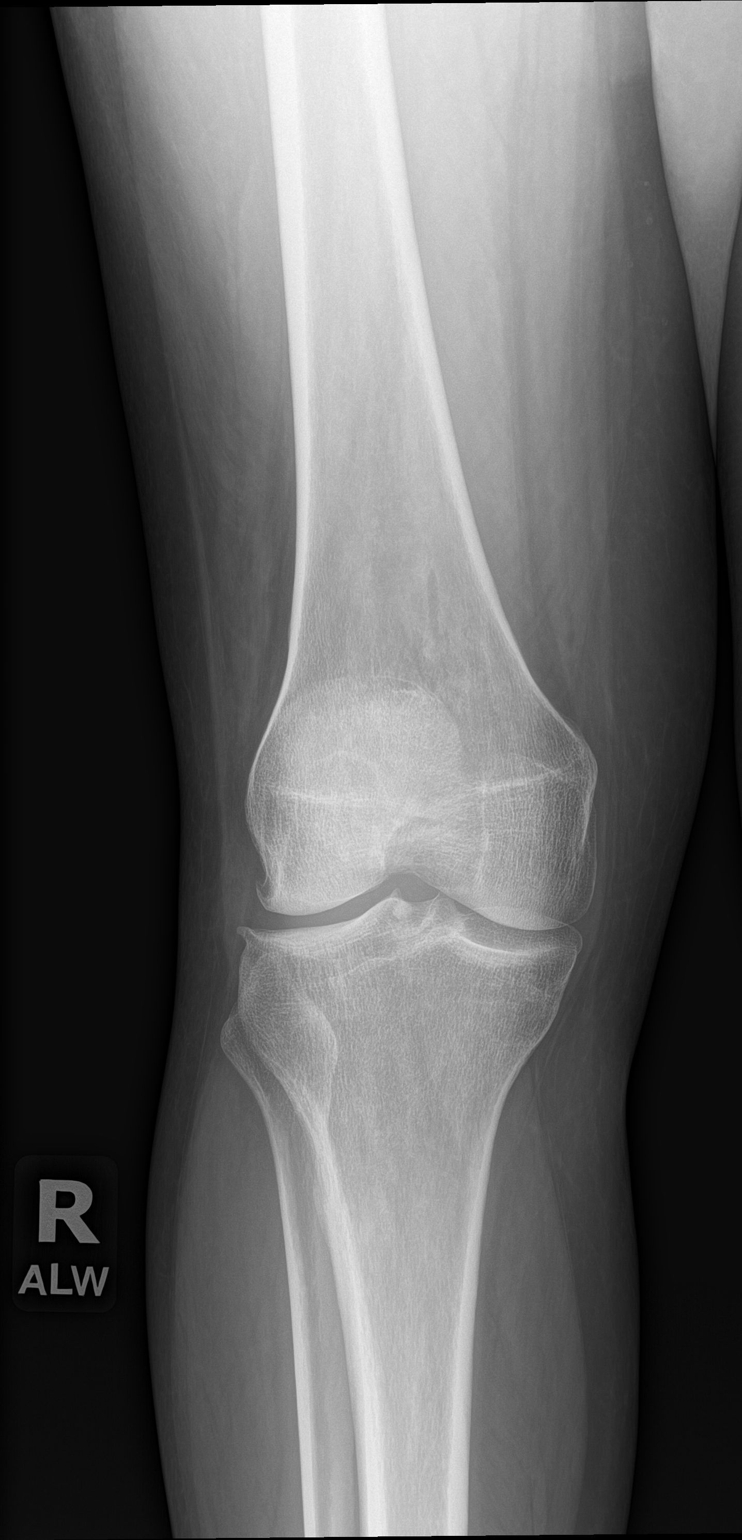

[knee obl (1 of 2)]
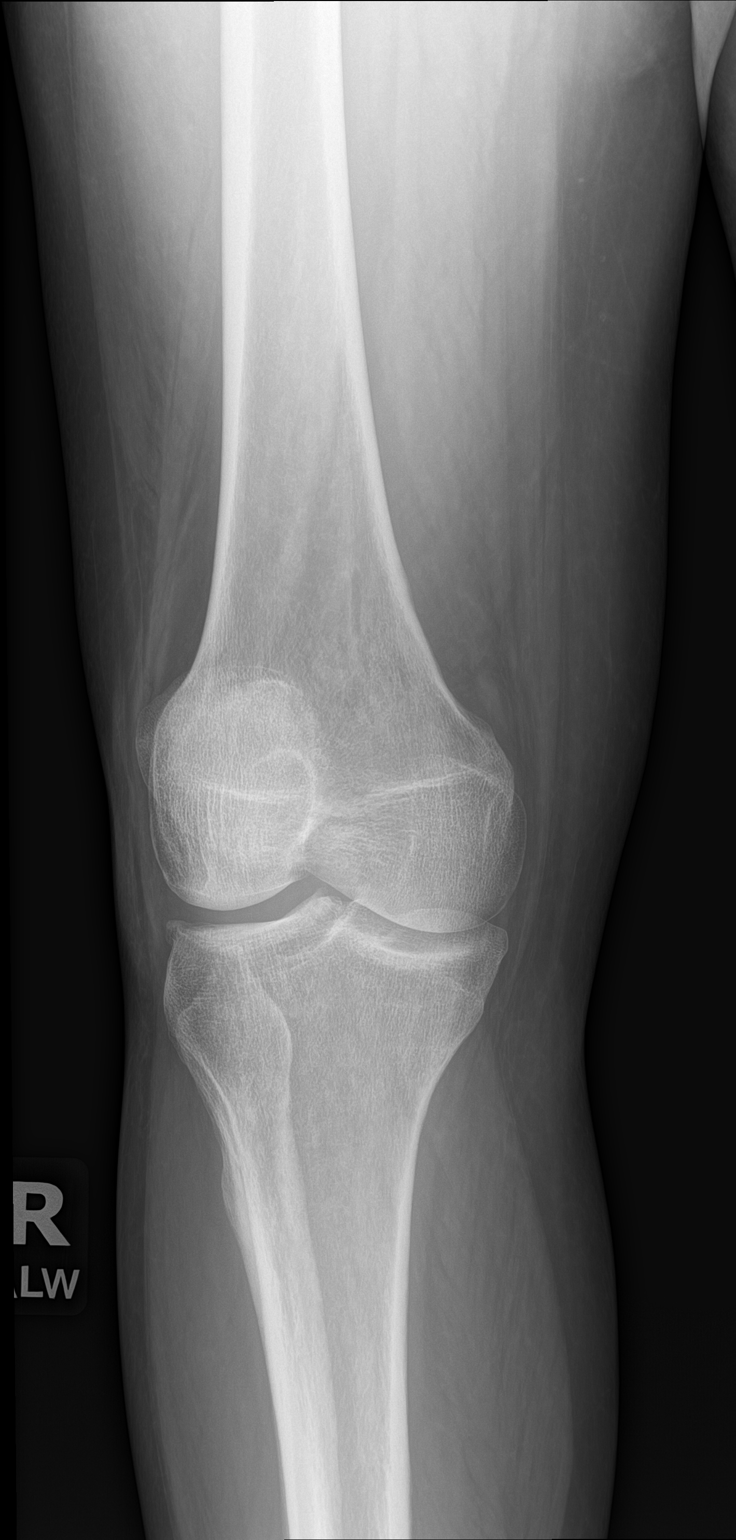

[knee obl (2 of 2)]
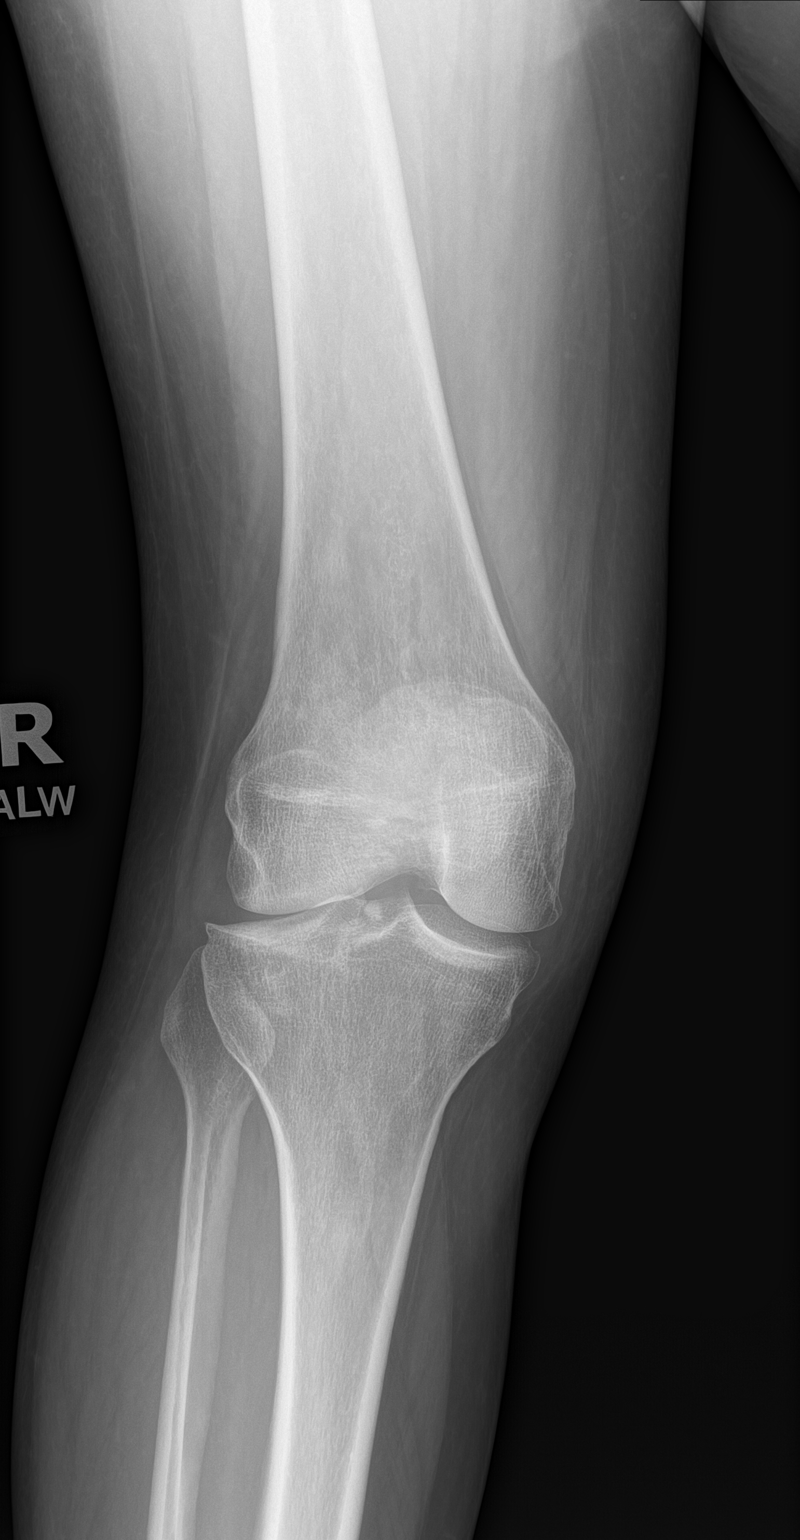

[knee lat]
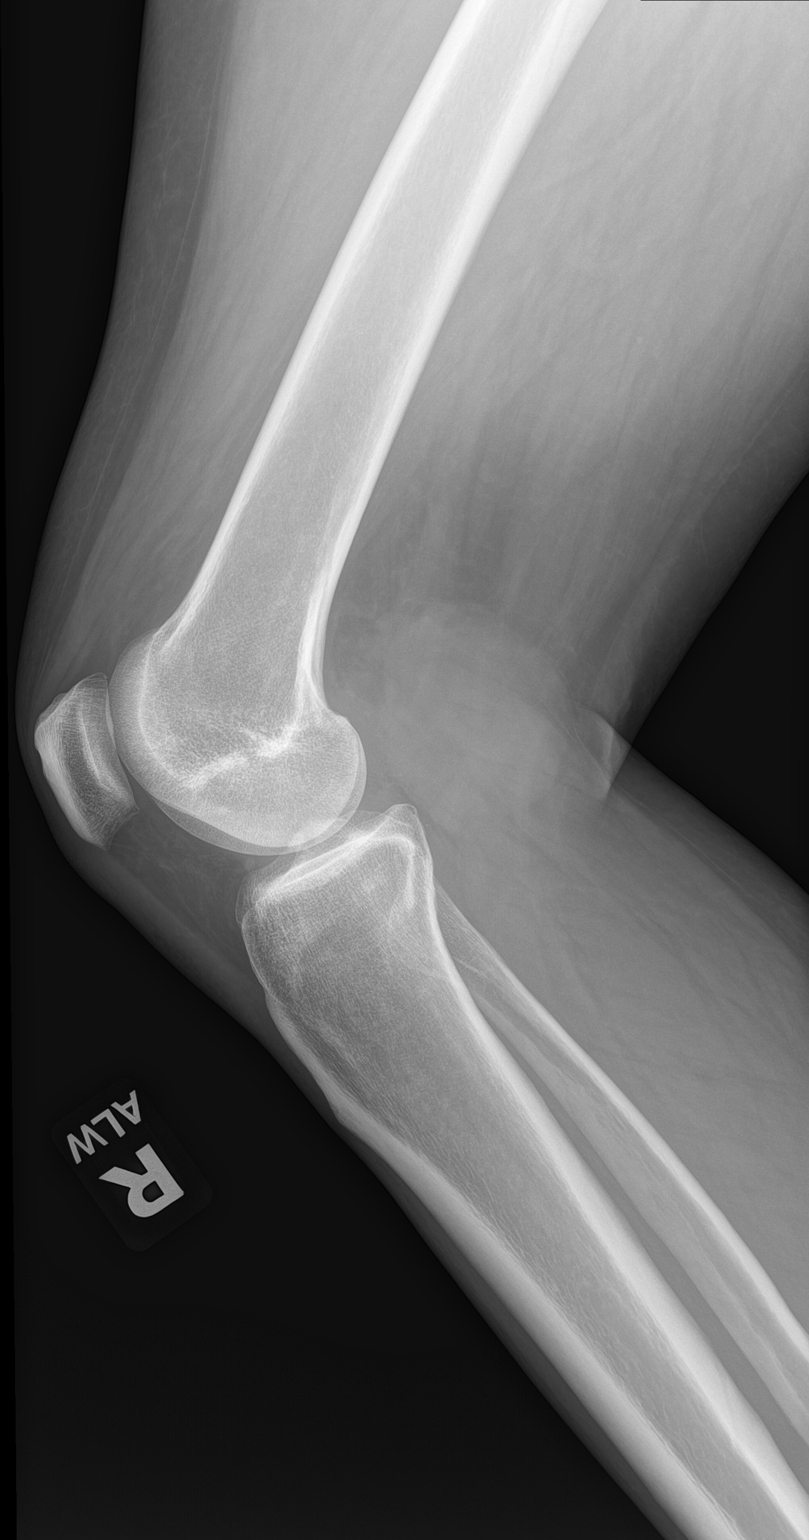

[4 of 4 positions shown; findings below may reference images not displayed]

FINDINGS: No evidence of fracture, dislocation, or joint effusion. Mild
lateral compartment osteophytosis without other evidence of
osteophytosis and preserved joint spaces throughout. Soft tissues
are unremarkable.
IMPRESSION: 1.  No fracture or dislocation of the right knee.

2. Mild lateral compartment osteophytosis without other evidence of
osteophytosis and preserved joint spaces throughout.

3.  No joint effusion.

## 2023-03-03 IMAGING — US US EXTREM LOW VENOUS*R*
1 series · 13 of 24 positions shown · non-contrast
Comparison: None.

CLINICAL DATA: Right lower extremity pain after moving furniture 1
week ago. Evaluate for DVT.



[Series 1: us venous img lower uni right (dvt) · portal-venous · 13 of 54 slices shown]
[im 1/54]
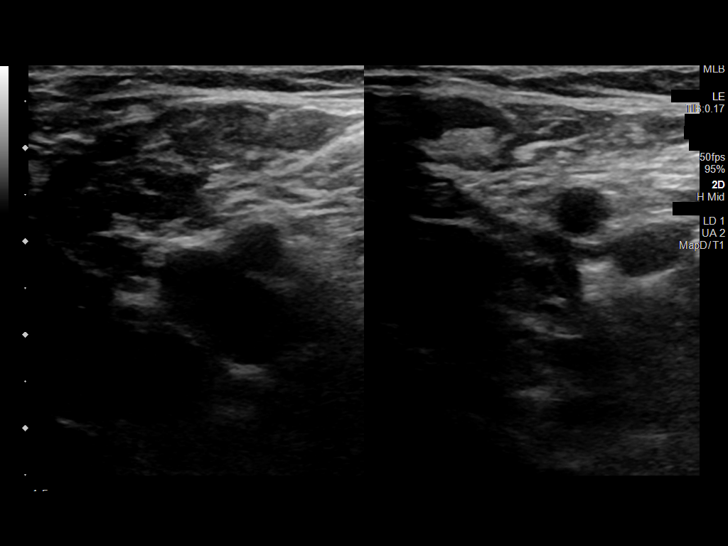
[im 5/54]
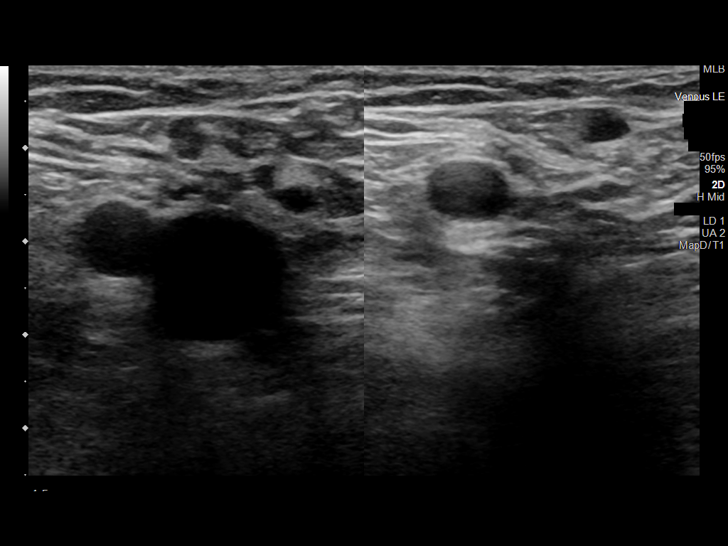
[im 10/54]
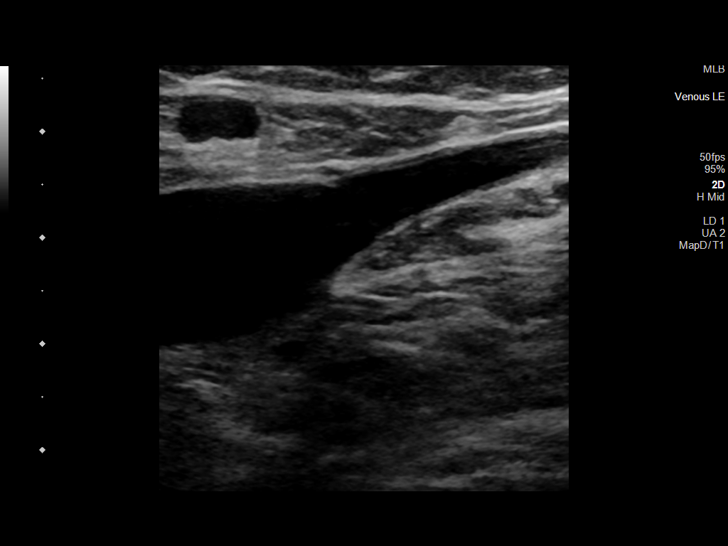
[im 14/54]
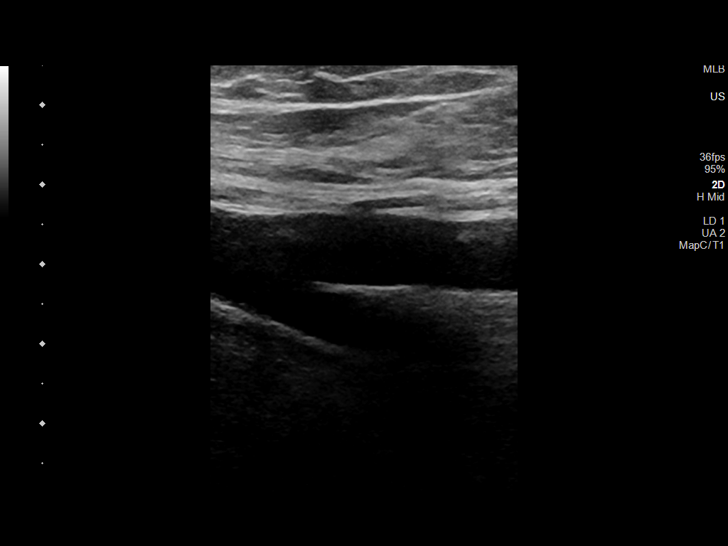
[im 19/54]
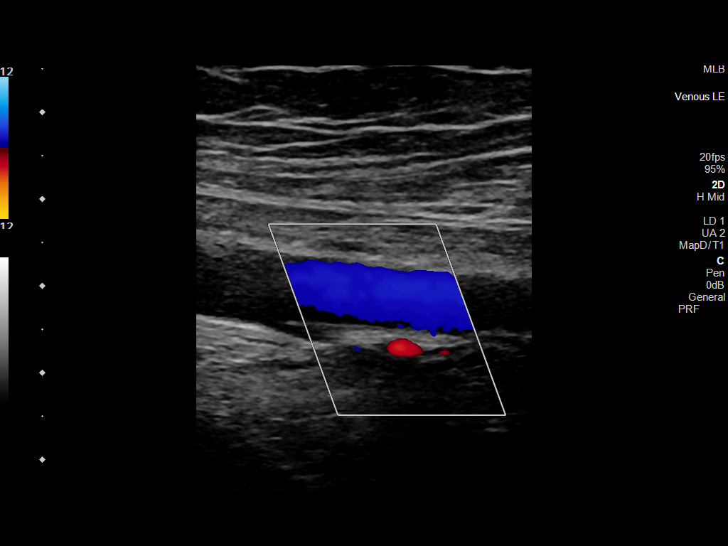
[im 24/54]
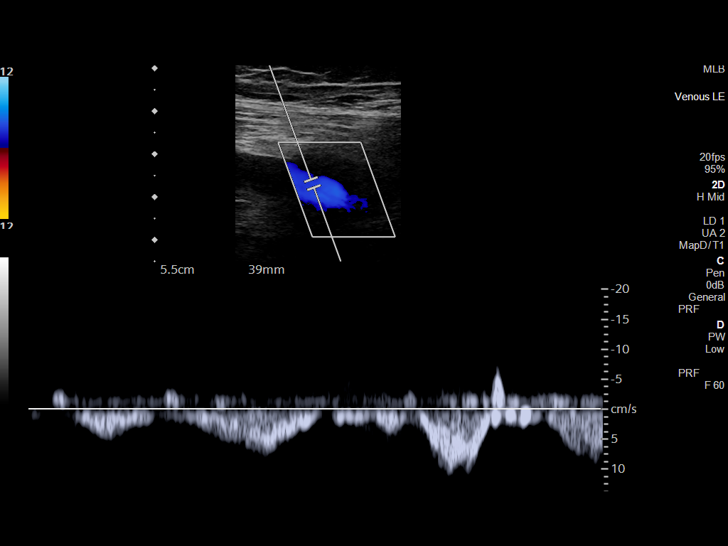
[im 28/54]
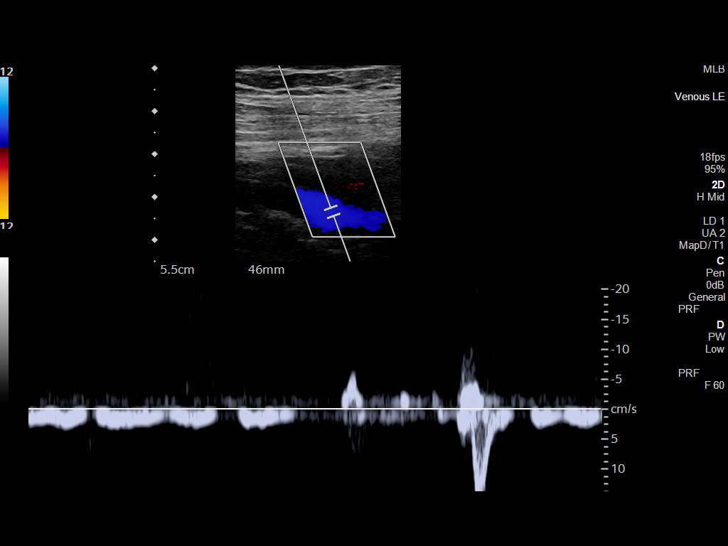
[im 30/54]
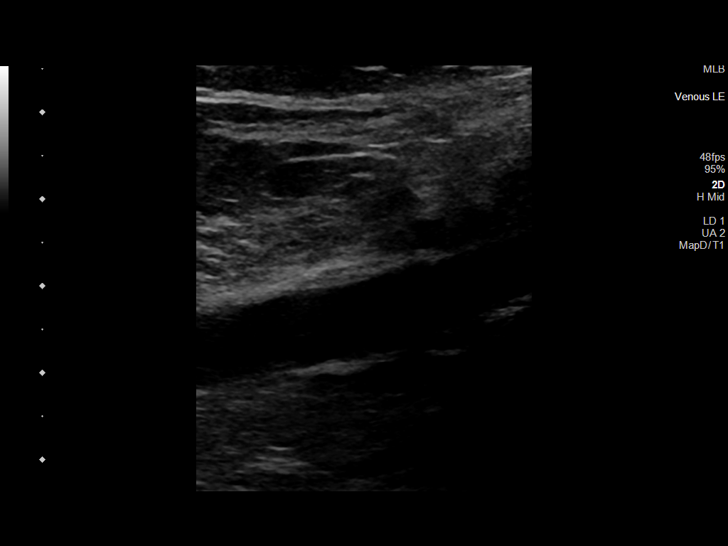
[im 35/54]
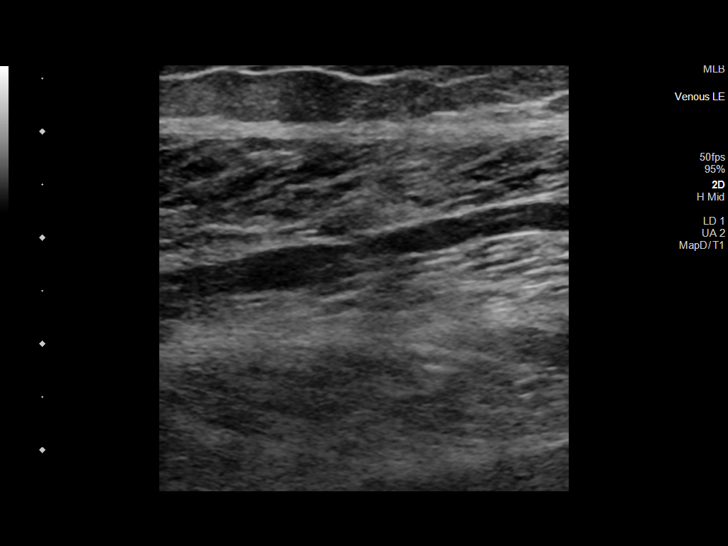
[im 40/54]
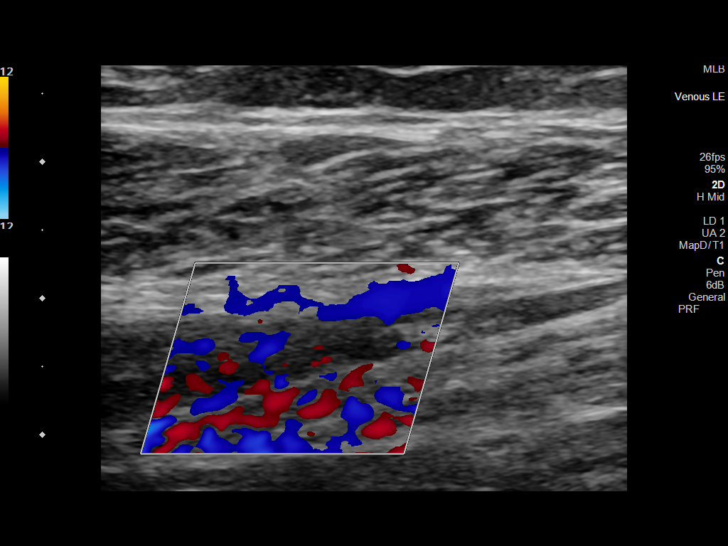
[im 44/54]
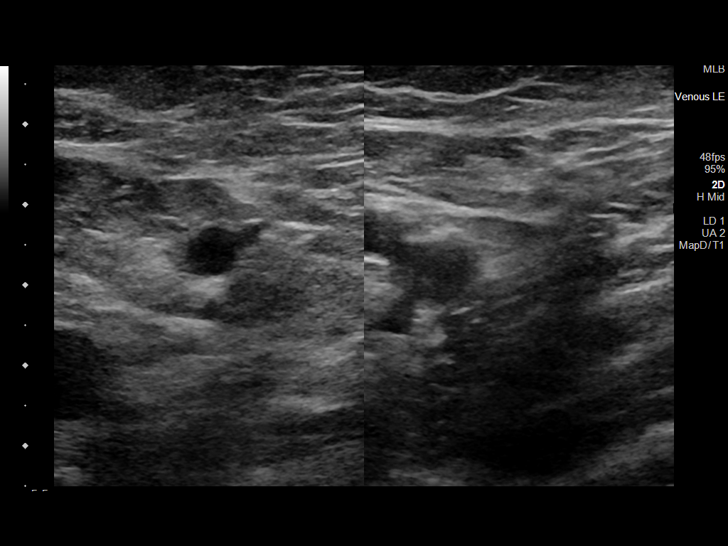
[im 49/54]
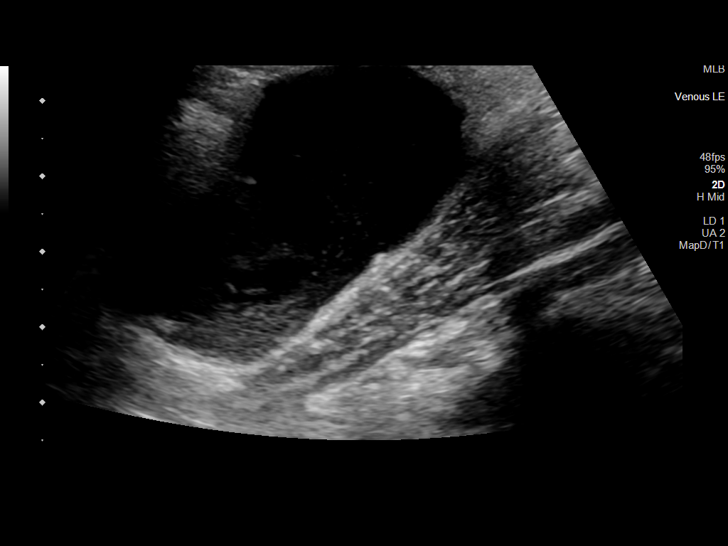
[im 54/54]
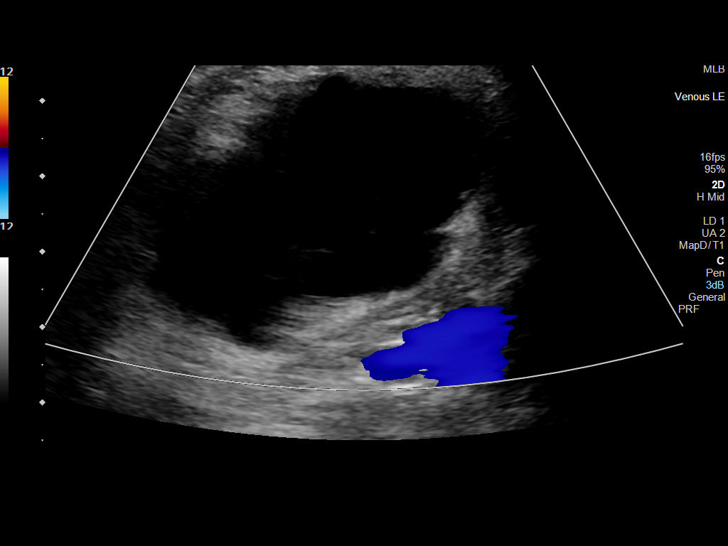

[13 of 24 positions shown; findings below may reference images not displayed]

FINDINGS: Contralateral Common Femoral Vein: Respiratory phasicity is normal
and symmetric with the symptomatic side. No evidence of thrombus.
Normal compressibility.

Common Femoral Vein: No evidence of thrombus. Normal
compressibility, respiratory phasicity and response to augmentation.

Saphenofemoral Junction: No evidence of thrombus. Normal
compressibility and flow on color Doppler imaging.

Profunda Femoral Vein: No evidence of thrombus. Normal
compressibility and flow on color Doppler imaging.

Femoral Vein: No evidence of thrombus. Normal compressibility,
respiratory phasicity and response to augmentation.

Popliteal Vein: No evidence of thrombus. Normal compressibility,
respiratory phasicity and response to augmentation.

Calf Veins: No evidence of thrombus. Normal compressibility and flow
on color Doppler imaging.

Superficial Great Saphenous Vein: No evidence of thrombus. Normal
compressibility.

Venous Reflux:  None.

Other Findings: Note is made of an approximately 5.1 x 5.1 x 3.0 cm
fluid collection within the right popliteal fossa compatible with a
Baker's cyst.
IMPRESSION: 1. No evidence of DVT within the right lower extremity.
2. Incidental note made of an approximately 5.1 cm right-sided
Baker's cyst.

## 2023-03-03 NOTE — Telephone Encounter (Signed)
I called to speak with the patient to review her PET scan.  No answer, unable to leave a voicemail because the mailbox was full.  I can try to call her back

## 2023-03-04 NOTE — Telephone Encounter (Signed)
Reviewed PET scan with the patient.  Her right mid lobe nodule is unchanged in size, has some very low-level hypermetabolism.  Overall reassuring study.  I think we can plan to repeat her CT chest in September 2024.  I will order a super D CT for that time.  Please reschedule her existing OV with BW from early August to early September after the CT so they can review

## 2023-04-18 ENCOUNTER — Ambulatory Visit: Payer: Medicaid Other | Admitting: Primary Care

## 2023-05-18 ENCOUNTER — Other Ambulatory Visit (HOSPITAL_COMMUNITY): Payer: Medicaid Other

## 2023-05-18 ENCOUNTER — Ambulatory Visit (HOSPITAL_COMMUNITY): Admission: RE | Admit: 2023-05-18 | Payer: Medicaid Other | Source: Ambulatory Visit

## 2023-05-26 ENCOUNTER — Ambulatory Visit: Payer: Medicaid Other | Admitting: Primary Care

## 2023-05-31 ENCOUNTER — Ambulatory Visit: Payer: Medicaid Other | Admitting: Primary Care

## 2023-06-07 ENCOUNTER — Telehealth: Payer: Self-pay | Admitting: Emergency Medicine

## 2023-06-07 ENCOUNTER — Ambulatory Visit: Payer: Medicaid Other | Admitting: Primary Care

## 2023-06-07 NOTE — Telephone Encounter (Signed)
Thank you for letting me know

## 2023-06-07 NOTE — Telephone Encounter (Signed)
Attempted to contact patient. No answer. Voicemail full. We do have financial assistance options within [facility]. Info and application can be provided to patient.   Dr. Delton Coombes - FYI on CT and patient did not show for scheduled OV today with EW. Thanks!

## 2023-06-08 NOTE — Telephone Encounter (Signed)
We need to work on getting her CT chest rescheduled and then f/u w RB, Thanks.

## 2023-06-14 ENCOUNTER — Ambulatory Visit (HOSPITAL_COMMUNITY): Payer: Self-pay

## 2023-06-17 ENCOUNTER — Encounter: Payer: Self-pay | Admitting: Primary Care

## 2023-08-17 ENCOUNTER — Other Ambulatory Visit: Payer: Self-pay | Admitting: Family Medicine

## 2023-08-17 DIAGNOSIS — Z1231 Encounter for screening mammogram for malignant neoplasm of breast: Secondary | ICD-10-CM

## 2023-08-30 ENCOUNTER — Ambulatory Visit: Payer: Self-pay

## 2023-09-22 ENCOUNTER — Encounter: Payer: Self-pay | Admitting: Emergency Medicine

## 2023-09-22 ENCOUNTER — Ambulatory Visit (INDEPENDENT_AMBULATORY_CARE_PROVIDER_SITE_OTHER): Payer: Self-pay | Admitting: Emergency Medicine

## 2023-09-22 VITALS — BP 130/87 | HR 62 | Temp 98.5°F | Ht 62.0 in | Wt 163.4 lb

## 2023-09-22 DIAGNOSIS — R918 Other nonspecific abnormal finding of lung field: Secondary | ICD-10-CM

## 2023-09-22 NOTE — Patient Instructions (Signed)
 We reviewed your PET scan from June.  Your pulmonary nodules are unchanged and there is no significant metabolic activity. You need a repeat CT scan of your chest to continue surveillance on these pulmonary nodules.  We will try to get this ordered now that you have insurance coverage.  Will contact you to discuss the co-pay and to arrange. Follow Dr. Shelah in 6 to 8 weeks so we can review your scan

## 2023-09-22 NOTE — Assessment & Plan Note (Signed)
 Pulmonary nodules were unchanged.  She remains asymptomatic.  The right middle lobe nodule (largest) did have some very low level metabolic uptake, SUV 2.05.  Unclear significance.  She was supposed to get surveillance imaging earlier but was unable to do so because she did not have insurance coverage.  She is now getting insurance, is about to get her insurance card from work.  I will reorder the CT chest now.  She wants to know the co-pay before getting the procedure done and we will work on this with our [facility]

## 2023-09-22 NOTE — Progress Notes (Signed)
 Subjective:    Patient ID: Princeton LITTIE Collum, female    DOB: 09-May-1971, 53 y.o.   MRN: [id number]  HPI 53 year old woman, has never smoked tobacco, did have THC exposure, with a history of chronic back pain and osteoarthritis.  She was admitted for cholecystitis and gastritis/peptic ulcer disease in early March.  During that hospitalization a CT of the abdomen and pelvis showed a right lower lobe 4 mm subpleural nodule.  Subsequently she had a coronary morphology CT chest 11/25/2022.  This study identified multiple pulmonary nodules as below.  She is here to discuss those scans and plan evaluation/follow-up.  Coronary CT 11/25/2022 reviewed by me showed multiple pulmonary nodules: 15 mm in the right middle lobe abutting the minor fissure, 7 mm in the right lower lobe anteriorly, 4 mm subpleural right lower lobe, 5 mm left lower lobe.  No infiltrates or effusions.  There was some mild central bronchial thickening   ROV 09/21/2022 --follow-up visit for 53 year old woman whom I saw in March 2024 for pulmonary nodules noted on a CT scan of the abdomen pelvis and also a coronary calcium scoring CT scan.  She had a 15 mm right middle lobe node at the minor fissure, 7 mm in the right lower lobe, 4 mm in the right lower lobe, 5 mm in the left lower lobe with some mild central bronchial thickening.  No history of autoimmune disease or tobacco use.  She underwent PET scan 02/22/2023 as below, and we had planned for a repeat CT scan of the chest but apparently there was a problem getting this scheduled because she did not have any insurance coverage. She rep[orts that she has overall been doing ok. 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 Also to glues, fumes.   PET scan 02/22/2023 reviewed by me, shows her right middle lobe nodule at the major fissure with some low-level FDG uptake (SUV max 2.05).  Her other nodules were unchanged in size.  No hypermetabolic  lymphadenopathy.  No evidence of distant disease     Review of Systems As per HPI  Past Medical History:  Diagnosis Date   Back pain    Chest pain    Constipation    Knee pain    Leg pain      Family History  Problem Relation Age of Onset   Heart disease Mother        MI   Diabetes Mother    CVA Father    Diabetes Father    Diabetes Maternal Grandmother    Congenital heart disease Other    Colon cancer Neg Hx    Stomach cancer Neg Hx    Esophageal cancer Neg Hx     No family hx lung CA   Social History   Socioeconomic History   Marital status: Single    Spouse name: Not on file   Number of children: 2   Years of education: Not on file   Highest education level: Not on file  Occupational History   Occupation: [employer]   Occupation: PCA  Tobacco Use   Smoking status: Former   Smokeless tobacco: Never   Tobacco comments:    Pt smoke marijuana for many years ARJ 12/09/22  Vaping Use   Vaping status: Never Used  Substance and Sexual Activity   Alcohol use: Yes    Comment: occasional   Drug use: Yes    Frequency: 2.0 times per week  Types: Marijuana   Sexual activity: Yes    Birth control/protection: Surgical  Other Topics Concern   Not on file  Social History Narrative   Not on file   Social Drivers of Health   Financial Resource Strain: Not on file  Food Insecurity: Not on file  Transportation Needs: Not on file  Physical Activity: Not on file  Stress: Not on file  Social Connections: Not on file  Intimate Partner Violence: Not on file    Has been in medical field, 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 Has lived in [place], [place], [place], [place] ? Mold exposure at home No hot tub or pool     Allergies  Allergen Reactions   Ivp Dye [Iodinated Contrast Media] Rash    Pt reports hot raised and flat rash and swelling in legs, no throat swelling./mw   Zosyn  [Piperacillin  Sod-Tazobactam So]     Rash     Outpatient Medications Prior to Visit   Medication Sig Dispense Refill   Multiple Vitamin (MULTIVITAMIN) tablet Take 1 tablet by mouth daily.     pantoprazole  (PROTONIX ) 40 MG tablet Take 1 tablet (40 mg total) by mouth daily. 90 tablet 3   bisacodyl  (DULCOLAX) 5 MG EC tablet Take 2 tablets (10 mg total) by mouth daily as needed for moderate constipation. (Patient not taking: Reported on 09/22/2023) 30 tablet 0   calcium carbonate (TUMS EX) 750 MG chewable tablet Chew 1 tablet by mouth daily. (Patient not taking: Reported on 09/22/2023)     cetirizine  (ZYRTEC ) 10 MG tablet Take 1 tablet (10 mg total) by mouth daily for 3 days. 3 tablet 0   hydrOXYzine  (ATARAX ) 10 MG tablet Take 1 tablet (10 mg total) by mouth 3 (three) times daily as needed. (Patient not taking: Reported on 09/22/2023) 30 tablet 0   No facility-administered medications prior to visit.        Objective:   Physical Exam  Vitals:   09/22/23 1124  BP: 130/87  Pulse: 62  Temp: 98.5 F (36.9 C)  TempSrc: Oral  SpO2: 97%  Weight: 163 lb 6.4 oz (74.1 kg)  Height: 5' 2 (1.575 m)   Gen: Pleasant, well-nourished, in no distress,  normal affect  ENT: No lesions,  mouth clear,  oropharynx clear, no postnasal drip  Neck: No JVD, no stridor  Lungs: No use of accessory muscles, no crackles or wheezing on normal respiration, no wheeze on forced expiration  Cardiovascular: RRR, heart sounds normal, no murmur or gallops, no peripheral edema  Musculoskeletal: No deformities, no cyanosis or clubbing  Neuro: alert, awake, non focal  Skin: Warm, no lesions or rash      Latest Ref Rng & Units 11/15/2022    2:53 AM 11/14/2022    4:28 AM 11/13/2022    9:55 AM  BMP  Glucose 70 - 99 mg/dL 85  84  97   BUN 6 - 20 mg/dL 12  8  11    Creatinine 0.44 - 1.00 mg/dL 9.36  9.28  9.32   Sodium 135 - 145 mmol/L 137  138  138   Potassium 3.5 - 5.1 mmol/L 3.5  2.9  2.9   Chloride 98 - 111 mmol/L 108  106  100   CO2 22 - 32 mmol/L 24  23  26    Calcium 8.9 - 10.3 mg/dL 8.2  7.9  9.6         Latest Ref Rng & Units 12/09/2022    3:45 PM 11/15/2022  2:53 AM 11/14/2022    4:28 AM  CBC  WBC 3.8 - 10.8 Thousand/uL 6.9  5.3  5.8   Hemoglobin 11.7 - 15.5 g/dL 85.5  87.5  87.3   Hematocrit 35.0 - 45.0 % 43.1  37.4  37.3   Platelets 140 - 400 Thousand/uL 205  177  164          Assessment & Plan:  Pulmonary nodules Pulmonary nodules were unchanged.  She remains asymptomatic.  The right middle lobe nodule (largest) did have some very low level metabolic uptake, SUV 2.05.  Unclear significance.  She was supposed to get surveillance imaging earlier but was unable to do so because she did not have insurance coverage.  She is now getting insurance, is about to get her insurance card from work.  I will reorder the CT chest now.  She wants to know the co-pay before getting the procedure done and we will work on this with our [facility]    Lamar Chris, MD, PhD 09/22/2023, 11:44 AM [facility] [phone] or if no answer before 7:00PM call [phone] For any issues after 7:00PM please call eLink [phone]

## 2023-10-13 ENCOUNTER — Ambulatory Visit
Admission: RE | Admit: 2023-10-13 | Discharge: 2023-10-13 | Disposition: A | Discharge status: Home / Self Care | Payer: BC Managed Care – PPO | Source: Ambulatory Visit | Attending: Emergency Medicine | Admitting: Emergency Medicine

## 2023-10-13 DIAGNOSIS — R918 Other nonspecific abnormal finding of lung field: Secondary | ICD-10-CM

## 2023-11-04 ENCOUNTER — Encounter: Payer: Self-pay | Admitting: Emergency Medicine

## 2023-11-04 ENCOUNTER — Ambulatory Visit: Payer: BC Managed Care – PPO | Admitting: Emergency Medicine

## 2023-11-04 VITALS — BP 103/72 | HR 68 | Ht 62.0 in | Wt 158.6 lb

## 2023-11-04 DIAGNOSIS — R918 Other nonspecific abnormal finding of lung field: Secondary | ICD-10-CM

## 2023-11-04 NOTE — Patient Instructions (Signed)
We reviewed your CT scan of the chest today.  There are stable pulmonary nodules present.  A right middle lobe nodule has not changed in size but it remains suspicious. We will plan to repeat your CT scan of the chest in 6 months (July) to follow for stability Please call our office if you decide you want to go forward with bronchoscopy before we follow-up in July to evaluate your pulmonary nodule sooner. Follow Dr. Delton Coombes in July after your CT so we can review those results.  Please call sooner if you have any problems.

## 2023-11-04 NOTE — Progress Notes (Signed)
Subjective:    Patient ID: Vanessa Carrillo, female    DOB: 07-06-1971, 53 y.o.   MRN: [id number]  HPI  ROV 09/21/2022 --follow-up visit for 53 year old woman whom I saw in March 2024 for pulmonary nodules noted on a CT scan of the abdomen pelvis and also a coronary calcium scoring CT scan.  She had a 15 mm right middle lobe node at the minor fissure, 7 mm in the right lower lobe, 4 mm in the right lower lobe, 5 mm in the left lower lobe with some mild central bronchial thickening.  No history of autoimmune disease or tobacco use.  She underwent PET scan 02/22/2023 as below, and we had planned for a repeat CT scan of the chest but apparently there was a problem getting this scheduled because she did not have any insurance coverage. She rep[orts that she has overall been doing ok. 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 Also to glues, fumes.   PET scan 02/22/2023 reviewed by me, shows her right middle lobe nodule at the major fissure with some low-level FDG uptake (SUV max 2.05).  Her other nodules were unchanged in size.  No hypermetabolic lymphadenopathy.  No evidence of distant disease   ROV 11/04/2023 --53 year old woman with a history of pulmonary nodules originally identified May/2024.  Unchanged in size on a follow-up PET scan 02/22/2023 with some very subtle hypermetabolism in her right middle lobe nodule at the major fissure.  We decided to repeat her CT chest, done 10/13/2023 is below.  No dyspnea, no cough, no sputum, no hemoptysis.   CT scan of the chest 10/13/2023 reviewed by me, shows that her pulmonary nodules are unchanged.  The dominant irregular 1.7 x 0.9 cm posterior right middle lobe pulmonary nodule slightly distorts the right major fissure.  Scattered other small pulmonary nodules are unchanged.  No new nodules seen     Review of Systems As per HPI  Past Medical History:  Diagnosis Date   Back pain    Chest pain    Constipation     Knee pain    Leg pain      Family History  Problem Relation Age of Onset   Heart disease Mother        MI   Diabetes Mother    CVA Father    Diabetes Father    Diabetes Maternal Grandmother    Congenital heart disease Other    Colon cancer Neg Hx    Stomach cancer Neg Hx    Esophageal cancer Neg Hx     No family hx lung CA   Social History   Socioeconomic History   Marital status: Single    Spouse name: Not on file   Number of children: 2   Years of education: Not on file   Highest education level: Not on file  Occupational History   Occupation: [employer]   Occupation: PCA  Tobacco Use   Smoking status: Former   Smokeless tobacco: Never   Tobacco comments:    Pt smoke marijuana for many years ARJ 12/09/22  Vaping Use   Vaping status: Never Used  Substance and Sexual Activity   Alcohol use: Yes    Comment: occasional   Drug use: Yes    Frequency: 2.0 times per week    Types: Marijuana   Sexual activity: Yes    Birth control/protection: Surgical  Other Topics Concern   Not on file  Social  History Narrative   Not on file   Social Drivers of Health   Financial Resource Strain: Not on file  Food Insecurity: Not on file  Transportation Needs: Not on file  Physical Activity: Not on file  Stress: Not on file  Social Connections: Not on file  Intimate Partner Violence: Not on file    Has been in medical field, 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 Has lived in [place], [place], [place], [place] ? Mold exposure at home No hot tub or pool     Allergies  Allergen Reactions   Ivp Dye [Iodinated Contrast Media] Rash    Pt reports hot raised and flat rash and swelling in legs, no throat swelling./mw   Zosyn [Piperacillin Sod-Tazobactam So]     Rash     Outpatient Medications Prior to Visit  Medication Sig Dispense Refill   Multiple Vitamin (MULTIVITAMIN) tablet Take 1 tablet by mouth daily.     pantoprazole (PROTONIX) 40 MG tablet Take 1 tablet (40 mg total) by  mouth daily. 90 tablet 3   bisacodyl (DULCOLAX) 5 MG EC tablet Take 2 tablets (10 mg total) by mouth daily as needed for moderate constipation. (Patient not taking: Reported on 09/22/2023) 30 tablet 0   calcium carbonate (TUMS EX) 750 MG chewable tablet Chew 1 tablet by mouth daily. (Patient not taking: Reported on 09/22/2023)     cetirizine (ZYRTEC) 10 MG tablet Take 1 tablet (10 mg total) by mouth daily for 3 days. 3 tablet 0   hydrOXYzine (ATARAX) 10 MG tablet Take 1 tablet (10 mg total) by mouth 3 (three) times daily as needed. (Patient not taking: Reported on 09/22/2023) 30 tablet 0   No facility-administered medications prior to visit.        Objective:   Physical Exam  Vitals:   11/04/23 0950 11/04/23 0951  BP:  103/72  Pulse: 68   SpO2: 96%   Weight:  158 lb 9.6 oz (71.9 kg)  Height:  5\' 2"  (1.575 m)   Gen: Pleasant, well-nourished, in no distress,  normal affect  ENT: No lesions,  mouth clear,  oropharynx clear, no postnasal drip  Neck: No JVD, no stridor  Lungs: No use of accessory muscles, no crackles or wheezing on normal respiration, no wheeze on forced expiration  Cardiovascular: RRR, heart sounds normal, no murmur or gallops, no peripheral edema  Musculoskeletal: No deformities, no cyanosis or clubbing  Neuro: alert, awake, non focal  Skin: Warm, no lesions or rash      Latest Ref Rng & Units 11/15/2022    2:53 AM 11/14/2022    4:28 AM 11/13/2022    9:55 AM  BMP  Glucose 70 - 99 mg/dL 85  84  97   BUN 6 - 20 mg/dL 12  8  11    Creatinine 0.44 - 1.00 mg/dL 1.61  0.96  0.45   Sodium 135 - 145 mmol/L 137  138  138   Potassium 3.5 - 5.1 mmol/L 3.5  2.9  2.9   Chloride 98 - 111 mmol/L 108  106  100   CO2 22 - 32 mmol/L 24  23  26    Calcium 8.9 - 10.3 mg/dL 8.2  7.9  9.6        Latest Ref Rng & Units 12/09/2022    3:45 PM 11/15/2022    2:53 AM 11/14/2022    4:28 AM  CBC  WBC 3.8 - 10.8 Thousand/uL 6.9  5.3  5.8   Hemoglobin 11.7 -  15.5 g/dL 16.1  09.6  04.5    Hematocrit 35.0 - 45.0 % 43.1  37.4  37.3   Platelets 140 - 400 Thousand/uL 205  177  164          Assessment & Plan:  Pulmonary nodules Her CT is stable over 11 months, but the right middle lobe perifissural nodule does look suspicious.  I explained this to her today.  We discussed either serial imaging or bronchoscopy now up to better characterize.  She is going to think about this.  She may decide to do bronchoscopy.  She will call me to let me know.  In the meantime I will order a repeat CT chest in 6 months to continue surveillance.  We reviewed your CT scan of the chest today.  There are stable pulmonary nodules present.  A right middle lobe nodule has not changed in size but it remains suspicious. We will plan to repeat your CT scan of the chest in 6 months (July) to follow for stability Please call our office if you decide you want to go forward with bronchoscopy before we follow-up in July to evaluate your pulmonary nodule sooner. Follow Dr. Delton Coombes in July after your CT so we can review those results.  Please call sooner if you have any problems.     Levy Pupa, MD, PhD 11/04/2023, 10:09 AM [facility] [phone] or if no answer before 7:00PM call [phone] For any issues after 7:00PM please call eLink [phone]

## 2023-11-04 NOTE — Assessment & Plan Note (Signed)
Her CT is stable over 11 months, but the right middle lobe perifissural nodule does look suspicious.  I explained this to her today.  We discussed either serial imaging or bronchoscopy now up to better characterize.  She is going to think about this.  She may decide to do bronchoscopy.  She will call me to let me know.  In the meantime I will order a repeat CT chest in 6 months to continue surveillance.  We reviewed your CT scan of the chest today.  There are stable pulmonary nodules present.  A right middle lobe nodule has not changed in size but it remains suspicious. We will plan to repeat your CT scan of the chest in 6 months (July) to follow for stability Please call our office if you decide you want to go forward with bronchoscopy before we follow-up in July to evaluate your pulmonary nodule sooner. Follow Dr. Delton Coombes in July after your CT so we can review those results.  Please call sooner if you have any problems.

## 2024-03-26 ENCOUNTER — Other Ambulatory Visit

## 2024-04-05 ENCOUNTER — Other Ambulatory Visit

## 2024-05-28 ENCOUNTER — Ambulatory Visit (HOSPITAL_COMMUNITY)
Admission: EM | Admit: 2024-05-28 | Discharge: 2024-05-28 | Disposition: A | Discharge status: Home / Self Care | Attending: Family Medicine | Admitting: Family Medicine

## 2024-05-28 ENCOUNTER — Encounter (HOSPITAL_COMMUNITY): Payer: Self-pay | Admitting: *Deleted

## 2024-05-28 DIAGNOSIS — J069 Acute upper respiratory infection, unspecified: Secondary | ICD-10-CM | POA: Diagnosis not present

## 2024-05-28 DIAGNOSIS — J4521 Mild intermittent asthma with (acute) exacerbation: Secondary | ICD-10-CM | POA: Diagnosis not present

## 2024-05-28 LAB — POC SARS CORONAVIRUS 2 AG -  ED: SARS Coronavirus 2 Ag: NEGATIVE

## 2024-05-28 MED ORDER — ALBUTEROL SULFATE (2.5 MG/3ML) 0.083% IN NEBU
INHALATION_SOLUTION | RESPIRATORY_TRACT | Status: AC
  Filled 2024-05-28: qty 3

## 2024-05-28 MED ORDER — PREDNISONE 20 MG PO TABS: 40.0000 mg | ORAL_TABLET | Freq: Every day | ORAL | 0 refills | Status: AC

## 2024-05-28 MED ORDER — ALBUTEROL SULFATE (2.5 MG/3ML) 0.083% IN NEBU
2.5000 mg | INHALATION_SOLUTION | Freq: Once | RESPIRATORY_TRACT | Status: AC
  Administered 2024-05-28: 2.5 mg via RESPIRATORY_TRACT

## 2024-05-28 MED ORDER — BENZONATATE 100 MG PO CAPS: 100.0000 mg | ORAL_CAPSULE | Freq: Three times a day (TID) | ORAL | 0 refills | Status: AC | PRN

## 2024-05-28 MED ORDER — ALBUTEROL SULFATE HFA 108 (90 BASE) MCG/ACT IN AERS: 2.0000 | INHALATION_SPRAY | RESPIRATORY_TRACT | 0 refills | Status: AC | PRN

## 2024-05-28 NOTE — ED Triage Notes (Signed)
 Pt states that she has had cough, congestion, body aches, headache X 3 days. She has been mucinex and dayquil

## 2024-05-28 NOTE — Discharge Instructions (Signed)
 The COVID test is negative.  Most likely this began as some other viral illness.  Albuterol  inhaler--do 2 puffs every 4 hours as needed for shortness of breath or wheezing (this is like the medicine we gave you and the breathing treatment here in the clinic)  Take prednisone  20 mg--2 daily for 5 days  Take benzonatate  100 mg, 1 tab every 8 hours as needed for cough.  Make sure you are drinking plenty of oral fluids

## 2024-05-28 NOTE — ED Provider Notes (Signed)
 [facility]    CSN: [id number] Arrival date & time: 05/28/24  1432      History   Chief Complaint Chief Complaint  Patient presents with   Cough   Nasal Congestion   Headache   Generalized Body Aches    HPI Vanessa Carrillo is a 53 y.o. female.    Cough Associated symptoms: headaches   Headache Associated symptoms: cough   Here for cough and congestion and rhinorrhea and headache.  She has also felt tired and achy.  Symptoms began about 3 days ago.  No fever or chills.  No nausea vomiting or diarrhea except she has had almost some posttussive emesis.  She has been wheezing and has felt like she cannot get mucus up.  She denies a history of asthma or bronchitis or ever having had to use an inhaler in the past.  She is a former smoker  She is allergic to IVP dye and Zosyn   Her periods are irregular  Past Medical History:  Diagnosis Date   Back pain    Chest pain    Constipation    Knee pain    Leg pain     Patient Active Problem List   Diagnosis Date Noted   Pulmonary nodules 12/09/2022   Cholelithiasis with acute cholecystitis 11/13/2022   Hypokalemia 11/13/2022   Left lower quadrant abdominal pain 11/13/2022   Obesity (BMI 30-39.9) 11/13/2022    Past Surgical History:  Procedure Laterality Date   CESAREAN SECTION     TONSILLECTOMY      OB History     Gravida  4   Para  2   Term  2   Preterm      AB  2   Living  2      SAB      IAB  2   Ectopic      Multiple      Live Births               Home Medications    Prior to Admission medications   Medication Sig Start Date End Date Taking? Authorizing Provider  albuterol  (VENTOLIN  HFA) 108 (90 Base) MCG/ACT inhaler Inhale 2 puffs into the lungs every 4 (four) hours as needed for wheezing or shortness of breath. 05/28/24  Yes Vonna Sharlet POUR, MD  benzonatate  (TESSALON ) 100 MG capsule Take 1 capsule (100 mg total) by mouth 3 (three) times daily as needed for cough.  05/28/24  Yes Vonna Sharlet POUR, MD  pantoprazole  (PROTONIX ) 40 MG tablet Take 1 tablet (40 mg total) by mouth daily. 01/10/23  Yes Stacia Glendia BRAVO, MD  predniSONE  (DELTASONE ) 20 MG tablet Take 2 tablets (40 mg total) by mouth daily with breakfast for 5 days. 05/28/24 06/02/24 Yes Jaleen Finch K, MD  Multiple Vitamin (MULTIVITAMIN) tablet Take 1 tablet by mouth daily.    [provider]    Family History Family History  Problem Relation Age of Onset   Heart disease Mother        MI   Diabetes Mother    CVA Father    Diabetes Father    Diabetes Maternal Grandmother    Congenital heart disease Other    Colon cancer Neg Hx    Stomach cancer Neg Hx    Esophageal cancer Neg Hx     Social History Social History   Tobacco Use   Smoking status: Former   Smokeless tobacco: Never   Tobacco comments:    Pt  smoke marijuana for many years ARJ 12/09/22  Vaping Use   Vaping status: Never Used  Substance Use Topics   Alcohol use: Yes    Comment: occasional   Drug use: Yes    Frequency: 2.0 times per week    Types: Marijuana     Allergies   Ivp dye [iodinated contrast media] and Zosyn  [piperacillin  sod-tazobactam so]   Review of Systems Review of Systems  Respiratory:  Positive for cough.   Neurological:  Positive for headaches.     Physical Exam Triage Vital Signs ED Triage Vitals  Encounter Vitals Group     BP 05/28/24 1653 129/82     Girls Systolic BP Percentile --      Girls Diastolic BP Percentile --      Boys Systolic BP Percentile --      Boys Diastolic BP Percentile --      Pulse Rate 05/28/24 1653 76     Resp 05/28/24 1653 20     Temp 05/28/24 1653 98.2 F (36.8 C)     Temp Source 05/28/24 1653 Oral     SpO2 05/28/24 1653 92 %     Weight --      Height --      Head Circumference --      Peak Flow --      Pain Score 05/28/24 1652 0     Pain Loc --      Pain Education --      Exclude from Growth Chart --    No data found.  Updated Vital  Signs BP 129/82 (BP Location: Right Arm)   Pulse 76   Temp 98.2 F (36.8 C) (Oral)   Resp 20 Comment: coughing  SpO2 92%   Visual Acuity Right Eye Distance:   Left Eye Distance:   Bilateral Distance:    Right Eye Near:   Left Eye Near:    Bilateral Near:     Physical Exam Vitals (Initially her respiratory rate was 20 but her O2 sat ranges from 90 to 92% on room air) reviewed.  Constitutional:      General: She is not in acute distress.    Appearance: She is not ill-appearing, toxic-appearing or diaphoretic.  HENT:     Right Ear: Tympanic membrane and ear canal normal.     Left Ear: Tympanic membrane and ear canal normal.     Nose: Congestion present.     Mouth/Throat:     Mouth: Mucous membranes are moist.     Comments: There is white and clear mucus draining Eyes:     Extraocular Movements: Extraocular movements intact.     Conjunctiva/sclera: Conjunctivae normal.     Pupils: Pupils are equal, round, and reactive to light.  Cardiovascular:     Rate and Rhythm: Normal rate and regular rhythm.     Heart sounds: No murmur heard. Pulmonary:     Effort: No respiratory distress.     Breath sounds: No stridor. No rhonchi or rales.     Comments: There are bilateral expiratory wheezes good air movement. Chest:     Chest wall: No tenderness.  Musculoskeletal:     Cervical back: Neck supple.  Lymphadenopathy:     Cervical: No cervical adenopathy.  Skin:    Capillary Refill: Capillary refill takes less than 2 seconds.     Coloration: Skin is not jaundiced or pale.  Neurological:     General: No focal deficit present.     Mental Status: She is  alert and oriented to person, place, and time.  Psychiatric:        Behavior: Behavior normal.      UC Treatments / Results  Labs (all labs ordered are listed, but only abnormal results are displayed) Labs Reviewed  POC SARS CORONAVIRUS 2 AG -  ED    EKG   Radiology No results found.  Procedures Procedures (including  critical care time)  Medications Ordered in UC Medications  albuterol  (PROVENTIL ) (2.5 MG/3ML) 0.083% nebulizer solution 2.5 mg (2.5 mg Nebulization Given 05/28/24 1717)    Initial Impression / Assessment and Plan / UC Course  I have reviewed the triage vital signs and the nursing notes.  Pertinent labs & imaging results that were available during my care of the patient were reviewed by me and considered in my medical decision making (see chart for details).  Clinical Course as of 05/28/24 1736  Mon May 28, 2024  1719 POC SARS Coronavirus 2 Ag-ED - Nasal Swab [PB]    Clinical Course User Index [PB] Vonna Sharlet POUR, MD    COVID test is negative.  Albuterol  nebulization is given here and it does improve her symptoms.  Her O2 sat went up to 93% on room air after that.  Tessalon  Perles are sent in for the cough.  Albuterol  and prednisone  are sent in for what might be a COPD or an asthma exacerbation.  Final Clinical Impressions(s) / UC Diagnoses   Final diagnoses:  Viral URI  Mild intermittent asthma with (acute) exacerbation     Discharge Instructions      The COVID test is negative.  Most likely this began as some other viral illness.  Albuterol  inhaler--do 2 puffs every 4 hours as needed for shortness of breath or wheezing (this is like the medicine we gave you and the breathing treatment here in the clinic)  Take prednisone  20 mg--2 daily for 5 days  Take benzonatate  100 mg, 1 tab every 8 hours as needed for cough.  Make sure you are drinking plenty of oral fluids      ED Prescriptions     Medication Sig Dispense Auth. Provider   albuterol  (VENTOLIN  HFA) 108 (90 Base) MCG/ACT inhaler Inhale 2 puffs into the lungs every 4 (four) hours as needed for wheezing or shortness of breath. 1 each Vonna Sharlet POUR, MD   predniSONE  (DELTASONE ) 20 MG tablet Take 2 tablets (40 mg total) by mouth daily with breakfast for 5 days. 10 tablet Vonna Sharlet POUR, MD    benzonatate  (TESSALON ) 100 MG capsule Take 1 capsule (100 mg total) by mouth 3 (three) times daily as needed for cough. 21 capsule Andy Allende K, MD      PDMP not reviewed this encounter.   Vonna Sharlet POUR, MD 05/28/24 [phone]
